# Patient Record
Sex: Female | Born: 1940 | Race: Black or African American | Hispanic: No | Smoking: Never smoker
Health system: Southern US, Community
[De-identification: ages and names within clinical notes are randomized; demographics above are authoritative.]

## PROBLEM LIST (undated history)

## (undated) DIAGNOSIS — E7800 Pure hypercholesterolemia, unspecified: Secondary | ICD-10-CM

## (undated) DIAGNOSIS — I10 Essential (primary) hypertension: Secondary | ICD-10-CM

## (undated) DIAGNOSIS — M1990 Unspecified osteoarthritis, unspecified site: Secondary | ICD-10-CM

## (undated) DIAGNOSIS — K219 Gastro-esophageal reflux disease without esophagitis: Secondary | ICD-10-CM

## (undated) DIAGNOSIS — E119 Type 2 diabetes mellitus without complications: Secondary | ICD-10-CM

## (undated) HISTORY — PX: ABDOMINAL HYSTERECTOMY: SHX81

## (undated) HISTORY — DX: Pure hypercholesterolemia, unspecified: E78.00

## (undated) HISTORY — DX: Unspecified osteoarthritis, unspecified site: M19.90

## (undated) HISTORY — DX: Gastro-esophageal reflux disease without esophagitis: K21.9

## (undated) HISTORY — PX: OTHER SURGICAL HISTORY: SHX169

---

## 2012-08-18 ENCOUNTER — Encounter (HOSPITAL_COMMUNITY): Payer: Self-pay

## 2012-08-18 ENCOUNTER — Emergency Department (HOSPITAL_COMMUNITY): Payer: Medicare Other

## 2012-08-18 ENCOUNTER — Emergency Department (HOSPITAL_COMMUNITY)
Admission: EM | Admit: 2012-08-18 | Discharge: 2012-08-18 | Disposition: A | Discharge status: Home / Self Care | Payer: Medicare Other | Attending: Emergency Medicine | Admitting: Emergency Medicine

## 2012-08-18 DIAGNOSIS — I10 Essential (primary) hypertension: Secondary | ICD-10-CM | POA: Insufficient documentation

## 2012-08-18 DIAGNOSIS — E119 Type 2 diabetes mellitus without complications: Secondary | ICD-10-CM | POA: Insufficient documentation

## 2012-08-18 DIAGNOSIS — R112 Nausea with vomiting, unspecified: Secondary | ICD-10-CM | POA: Insufficient documentation

## 2012-08-18 DIAGNOSIS — K859 Acute pancreatitis without necrosis or infection, unspecified: Secondary | ICD-10-CM | POA: Insufficient documentation

## 2012-08-18 HISTORY — DX: Essential (primary) hypertension: I10

## 2012-08-18 HISTORY — DX: Type 2 diabetes mellitus without complications: E11.9

## 2012-08-18 LAB — CBC WITH DIFFERENTIAL/PLATELET
Lymphocytes Relative: 40 % (ref 12–46)
Lymphs Abs: 2.8 10*3/uL (ref 0.7–4.0)
Neutro Abs: 3.3 10*3/uL (ref 1.7–7.7)
Neutrophils Relative %: 47 % (ref 43–77)
Platelets: 292 10*3/uL (ref 150–400)
RBC: 3.65 MIL/uL — ABNORMAL LOW (ref 3.87–5.11)
WBC: 7 10*3/uL (ref 4.0–10.5)

## 2012-08-18 LAB — URINE MICROSCOPIC-ADD ON

## 2012-08-18 LAB — COMPREHENSIVE METABOLIC PANEL
AST: 18 U/L (ref 0–37)
CO2: 28 mEq/L (ref 19–32)
Calcium: 10.1 mg/dL (ref 8.4–10.5)
Creatinine, Ser: 1.09 mg/dL (ref 0.50–1.10)
GFR calc Af Amer: 57 mL/min — ABNORMAL LOW (ref >90)
GFR calc non Af Amer: 49 mL/min — ABNORMAL LOW (ref >90)

## 2012-08-18 LAB — URINALYSIS, ROUTINE W REFLEX MICROSCOPIC
Glucose, UA: NEGATIVE mg/dL
Leukocytes, UA: NEGATIVE
Nitrite: POSITIVE — AB
pH: 7 (ref 5.0–8.0)

## 2012-08-18 LAB — GLUCOSE, CAPILLARY: Glucose-Capillary: 92 mg/dL (ref 70–99)

## 2012-08-18 LAB — LIPASE, BLOOD: Lipase: 285 U/L — ABNORMAL HIGH (ref 11–59)

## 2012-08-18 LAB — TROPONIN I: Troponin I: <0.3 ng/mL (ref <0.30)

## 2012-08-18 MED ORDER — ONDANSETRON 8 MG PO TBDP
8.0000 mg | ORAL_TABLET | Freq: Once | ORAL | Status: AC
  Administered 2012-08-18: 8 mg via ORAL
  Filled 2012-08-18: qty 1

## 2012-08-18 MED ORDER — ONDANSETRON HCL 8 MG PO TABS: 8.0000 mg | ORAL_TABLET | Freq: Three times a day (TID) | ORAL | Status: DC | PRN

## 2012-08-18 NOTE — ED Notes (Signed)
Pt c/o vomiting that started Friday but has not vomited since the previous day. Nausea continues.

## 2012-08-18 NOTE — ED Provider Notes (Addendum)
CSN: [id number]     Arrival date & time 08/18/12  1655 History  This chart was scribed for Doug Sou, MD by Bennett Scrape, ED Scribe. This patient was seen in room APA14/[facility]14 and the patient's care was started at 5:30 PM.   First MD Initiated Contact with Patient 08/18/12 1722     Chief Complaint  Patient presents with  . Emesis  . Abdominal Pain    Patient is a 72 y.o. female presenting with abdominal pain. The history is provided by the patient. No language interpreter was used.  Abdominal Pain This is a recurrent problem. The current episode started more than 2 days ago. The problem occurs constantly. The problem has been gradually improving. Associated symptoms include abdominal pain. Nothing aggravates the symptoms. Treatments tried: GERD meds. The treatment provided no relief.    HPI Comments: Caitlin Yates is a 72 y.o. female who presents to the Emergency Department complaining of 3 days of non-radiating LUQ pain described as burning with associated nausea and emesis. Last episode of emesis was 2 days ago, but she still reports nausea. She rates her pain a 5 out of 10 today which she states is mild.presently pain -feree but still nauseated  She admits that she feels significantly better today compared to yesterday. She admits experiencing prior episodes of the same diagnosed as GERD .She states that she has been taking her GERD medications as prescribed with no improvement. Pt denies taking OTC medications at home to improve symptoms. Last normal BM was yesterday. She denies fevers, hematemesis, CP and SOB as associated symptoms. She has a h/o DM and leg stent placement due to poor circulation. Pt denies smoking and alcohol use. No treatment pta. Nothing make sx better or worse. No hematemesis. Last bm yesterday, normal  Past Medical History  Diagnosis Date  . Diabetes mellitus without complication   . Hypertension    History reviewed. No pertinent past surgical history. No  family history on file. History  Substance Use Topics  . Smoking status: Never Smoker   . Smokeless tobacco: Not on file  . Alcohol Use: No  Review of Systems  Constitutional: Negative.   HENT: Negative.   Respiratory: Negative.   Cardiovascular: Negative.   Gastrointestinal: Positive for nausea and abdominal pain.  Musculoskeletal: Negative.   Skin: Negative.   Neurological: Negative.   Psychiatric/Behavioral: Negative.     Allergies  Review of patient's allergies indicates no known allergies.  Home Medications  No current outpatient prescriptions on file.  Triage Vitals: BP 125/92  Pulse 83  Temp(Src) 97.7 F (36.5 C) (Oral)  Resp 18  Ht 5\' 2"  (1.575 m)  Wt 174 lb (78.926 kg)  BMI 31.82 kg/m2  SpO2 100%  Physical Exam  Nursing note and vitals reviewed. Constitutional: She appears well-developed and well-nourished.  HENT:  Head: Normocephalic and atraumatic.  Eyes: Conjunctivae are normal. Pupils are equal, round, and reactive to light.  Neck: Neck supple. No tracheal deviation present. No thyromegaly present.  Cardiovascular: Normal rate and regular rhythm.   No murmur heard. Pulmonary/Chest: Effort normal and breath sounds normal.  Abdominal: Soft. Bowel sounds are normal. She exhibits no distension. There is no tenderness.  Musculoskeletal: Normal range of motion. She exhibits no edema and no tenderness.  Neurological: She is alert. Coordination normal.  Skin: Skin is warm and dry. No rash noted.  Psychiatric: She has a normal mood and affect.    ED Course   Procedures (including critical care time)  DIAGNOSTIC  STUDIES: Oxygen Saturation is 100% on room air, normal by my interpretation.    COORDINATION OF CARE: 5:33 PM-Discussed treatment plan which includes x-ray of abdomen, CBC panel, CMP and UA with pt at bedside and pt agreed to plan. Pt declined pain medication  Labs Reviewed  COMPREHENSIVE METABOLIC PANEL - Abnormal; Notable for the following:     Total Bilirubin 0.1 (*)    GFR calc non Af Amer 49 (*)    GFR calc Af Amer 57 (*)    All other components within normal limits  LIPASE, BLOOD - Abnormal; Notable for the following:    Lipase 285 (*)    All other components within normal limits  CBC WITH DIFFERENTIAL - Abnormal; Notable for the following:    RBC 3.65 (*)    Hemoglobin 10.9 (*)    HCT 33.1 (*)    All other components within normal limits  URINALYSIS, ROUTINE W REFLEX MICROSCOPIC - Abnormal; Notable for the following:    APPearance HAZY (*)    Nitrite POSITIVE (*)    All other components within normal limits  URINE MICROSCOPIC-ADD ON - Abnormal; Notable for the following:    Bacteria, UA MANY (*)    All other components within normal limits  URINE CULTURE  GLUCOSE, CAPILLARY  TROPONIN I   Dg Abd Acute W/chest  08/18/2012   *RADIOLOGY REPORT*  Clinical Data: Abdominal pain with emesis today.  History of hypertension and diabetes.  ACUTE ABDOMEN SERIES (ABDOMEN 2 VIEW & CHEST 1 VIEW)  Comparison: None.  Findings: The heart size is normal.  There is mild atelectasis or scarring adjacent to the left heart border.  The lungs are otherwise clear.  There is no pleural effusion.  Moderate stool is present throughout the colon.  There is no evidence of bowel obstruction or free intraperitoneal air.  There is a left upper quadrant calcification measuring 8 mm.  This may be renal or splenic.  No other suspicious calcifications are identified.  There are multiple pelvic calcifications bilaterally which are likely phleboliths.  IMPRESSION:  1.  No acute abdominal or cardiopulmonary process identified. 2.  Nonspecific left upper quadrant calcification. 3.  Prominent stool throughout the colon, possibly reflecting constipation.   Original Report Authenticated By: Carey Bullocks, M.D.   No diagnosis found.   Date: 08/18/2012  Rate: 75  Rhythm: normal sinus rhythm and premature ventricular contractions (PVC)  QRS Axis: left  Intervals:  normal  ST/T Wave abnormalities: nonspecific T wave changes  Conduction Disutrbances:none  Narrative Interpretation:   Old EKG Reviewed: none available Results for orders placed during the hospital encounter of 08/18/12  GLUCOSE, CAPILLARY      Result Value Range   Glucose-Capillary 92  70 - 99 mg/dL  COMPREHENSIVE METABOLIC PANEL      Result Value Range   Sodium 139  135 - 145 mEq/L   Potassium 3.6  3.5 - 5.1 mEq/L   Chloride 99  96 - 112 mEq/L   CO2 28  19 - 32 mEq/L   Glucose, Bld 84  70 - 99 mg/dL   BUN 20  6 - 23 mg/dL   Creatinine, Ser 4.09  0.50 - 1.10 mg/dL   Calcium 81.1  8.4 - 91.4 mg/dL   Total Protein 7.3  6.0 - 8.3 g/dL   Albumin 3.8  3.5 - 5.2 g/dL   AST 18  0 - 37 U/L   ALT 13  0 - 35 U/L   Alkaline Phosphatase 48  39 -  117 U/L   Total Bilirubin 0.1 (*) 0.3 - 1.2 mg/dL   GFR calc non Af Amer 49 (*) >90 mL/min   GFR calc Af Amer 57 (*) >90 mL/min  LIPASE, BLOOD      Result Value Range   Lipase 285 (*) 11 - 59 U/L  CBC WITH DIFFERENTIAL      Result Value Range   WBC 7.0  4.0 - 10.5 K/uL   RBC 3.65 (*) 3.87 - 5.11 MIL/uL   Hemoglobin 10.9 (*) 12.0 - 15.0 g/dL   HCT 78.2 (*) 95.6 - 21.3 %   MCV 90.7  78.0 - 100.0 fL   MCH 29.9  26.0 - 34.0 pg   MCHC 32.9  30.0 - 36.0 g/dL   RDW 08.6  57.8 - 46.9 %   Platelets 292  150 - 400 K/uL   Neutrophils Relative % 47  43 - 77 %   Neutro Abs 3.3  1.7 - 7.7 K/uL   Lymphocytes Relative 40  12 - 46 %   Lymphs Abs 2.8  0.7 - 4.0 K/uL   Monocytes Relative 9  3 - 12 %   Monocytes Absolute 0.6  0.1 - 1.0 K/uL   Eosinophils Relative 4  0 - 5 %   Eosinophils Absolute 0.3  0.0 - 0.7 K/uL   Basophils Relative 0  0 - 1 %   Basophils Absolute 0.0  0.0 - 0.1 K/uL  URINALYSIS, ROUTINE W REFLEX MICROSCOPIC      Result Value Range   Color, Urine YELLOW  YELLOW   APPearance HAZY (*) CLEAR   Specific Gravity, Urine 1.020  1.005 - 1.030   pH 7.0  5.0 - 8.0   Glucose, UA NEGATIVE  NEGATIVE mg/dL   Hgb urine dipstick NEGATIVE   NEGATIVE   Bilirubin Urine NEGATIVE  NEGATIVE   Ketones, ur NEGATIVE  NEGATIVE mg/dL   Protein, ur NEGATIVE  NEGATIVE mg/dL   Urobilinogen, UA 0.2  0.0 - 1.0 mg/dL   Nitrite POSITIVE (*) NEGATIVE   Leukocytes, UA NEGATIVE  NEGATIVE  TROPONIN I      Result Value Range   Troponin I <0.30  <0.30 ng/mL  URINE MICROSCOPIC-ADD ON      Result Value Range   WBC, UA 0-2  <3 WBC/hpf   RBC / HPF 0-2  <3 RBC/hpf   Bacteria, UA MANY (*) RARE   Dg Abd Acute W/chest  08/18/2012   *RADIOLOGY REPORT*  Clinical Data: Abdominal pain with emesis today.  History of hypertension and diabetes.  ACUTE ABDOMEN SERIES (ABDOMEN 2 VIEW & CHEST 1 VIEW)  Comparison: None.  Findings: The heart size is normal.  There is mild atelectasis or scarring adjacent to the left heart border.  The lungs are otherwise clear.  There is no pleural effusion.  Moderate stool is present throughout the colon.  There is no evidence of bowel obstruction or free intraperitoneal air.  There is a left upper quadrant calcification measuring 8 mm.  This may be renal or splenic.  No other suspicious calcifications are identified.  There are multiple pelvic calcifications bilaterally which are likely phleboliths.  IMPRESSION:  1.  No acute abdominal or cardiopulmonary process identified. 2.  Nonspecific left upper quadrant calcification. 3.  Prominent stool throughout the colon, possibly reflecting constipation.   Original Report Authenticated By: Carey Bullocks, M.D.    7:35 PM patient feels well after treatment with oral Zofran. No longer nauseated. Abdominal discomfort is minimal. She declines pain medicine.  X-rays viewed by me . MDM  Case discussed with Rourk plan clear liquid diet for 12 hours. If she feels well she can advance to bland diet tomorrow. Dr.Rourk's office will call hertomorrow to arrange to be seen for outpatient evaluation in the office within 48 hours. She is instructed to return to the emergency department if he feels worse for  any reason. Prescriptions zofran Dx#1 pancreatitis #2anemia      Doug Sou, MD 08/18/12 1950  Doug Sou, MD 08/19/12 0001

## 2012-08-18 NOTE — ED Notes (Signed)
Pt c/o nausea and vomiting since Friday. Pt states she has not vomited today but still feels nauseated. Pt denies abdominal and chest pain, SOB.

## 2012-08-19 ENCOUNTER — Encounter: Payer: Self-pay | Admitting: Gastroenterology

## 2012-08-19 ENCOUNTER — Telehealth: Payer: Self-pay | Admitting: Internal Medicine

## 2012-08-19 ENCOUNTER — Ambulatory Visit (INDEPENDENT_AMBULATORY_CARE_PROVIDER_SITE_OTHER): Payer: PRIVATE HEALTH INSURANCE | Admitting: Gastroenterology

## 2012-08-19 VITALS — BP 123/71 | HR 82 | Temp 97.0°F | Ht 62.0 in | Wt 172.6 lb

## 2012-08-19 DIAGNOSIS — K859 Acute pancreatitis without necrosis or infection, unspecified: Secondary | ICD-10-CM

## 2012-08-19 MED ORDER — DEXLANSOPRAZOLE 60 MG PO CPDR: 60.0000 mg | DELAYED_RELEASE_CAPSULE | Freq: Every day | ORAL | Status: DC

## 2012-08-19 MED ORDER — HYDROCODONE-ACETAMINOPHEN 5-325 MG PO TABS: 1.0000 | ORAL_TABLET | Freq: Four times a day (QID) | ORAL | Status: DC | PRN

## 2012-08-19 NOTE — Progress Notes (Signed)
Primary Care Physician:  Leone Payor, MD Primary Gastroenterologist:  Dr. Jena Gauss   Chief Complaint  Patient presents with  . Pancreatitis    hospital follow up    HPI:   Caitlin Yates is a 72 year old female who presents today after an ED visit yesterday evening secondary to abdominal pain and GERD symptoms. LUQ pain noted with some improvement at time of ED visit. Found to have elevated lipase at 285, normal LFTs, and normocytic anemia with Hgb 10.9.  Denies alcohol use. States Friday acute onset of epigastric pain. N/V till Sunday. Gagging, dry heaving. LUQ discomfort improved slightly but still persistent at times. No hematemesis. No melena. One other episode about a month ago of fleeting pain. Denies fever/chills. "heavy" food worsens abdominal pain. Omeprazole daily up until last Friday.   No changes in bowel habits. No rectal bleeding. Last colonoscopy about 3-4 years ago per patient, in [place] by Dr. Samuella Cota. No prior EGD.   Past Medical History  Diagnosis Date  . Diabetes mellitus without complication   . Hypertension   . Hypercholesterolemia   . GERD (gastroesophageal reflux disease)     Past Surgical History  Procedure Laterality Date  . Vascular stent right leg      Current Outpatient Prescriptions  Medication Sig Dispense Refill  . aspirin EC 81 MG tablet Take 81 mg by mouth every morning.      Marland Kitchen atenolol-chlorthalidone (TENORETIC) 50-25 MG per tablet Take 1 tablet by mouth every morning.      Marland Kitchen atorvastatin (LIPITOR) 20 MG tablet Take 20 mg by mouth every morning.      . clopidogrel (PLAVIX) 75 MG tablet Take 75 mg by mouth every morning.      Marland Kitchen glimepiride (AMARYL) 2 MG tablet Take 2 mg by mouth every morning.      Marland Kitchen lisinopril (PRINIVIL,ZESTRIL) 10 MG tablet Take 10 mg by mouth every morning.      . Multiple Vitamins-Minerals (CENTURY MATURE ADULT FORMULA PO) Take 1 tablet by mouth every morning.      . ondansetron (ZOFRAN) 8 MG tablet Take 1 tablet (8 mg  total) by mouth every 8 (eight) hours as needed for nausea.  6 tablet  0  . potassium chloride SA (K-DUR,KLOR-CON) 20 MEQ tablet Take 20 mEq by mouth daily.      . sitaGLIPtan-metformin (JANUMET) 50-1000 MG per tablet Take 1 tablet by mouth 2 (two) times daily.      . sucralfate (CARAFATE) 1 G tablet Take 1 g by mouth daily as needed (for stomach pain).      . traMADol (ULTRAM) 50 MG tablet Take 50 mg by mouth 2 (two) times daily.      Marland Kitchen dexlansoprazole (DEXILANT) 60 MG capsule Take 1 capsule (60 mg total) by mouth daily.  30 capsule  3  . HYDROcodone-acetaminophen (NORCO) 5-325 MG per tablet Take 1 tablet by mouth every 6 (six) hours as needed for pain.  30 tablet  0   No current facility-administered medications for this visit.    Allergies as of 08/19/2012  . (No Known Allergies)    Family History  Problem Relation Age of Onset  . Colon cancer Neg Hx     History   Social History  . Marital Status: Legally Separated    Spouse Name: N/A    Number of Children: N/A  . Years of Education: N/A   Occupational History  . Not on file.   Social History Main Topics  . Smoking status:  Never Smoker   . Smokeless tobacco: Not on file  . Alcohol Use: No  . Drug Use: No  . Sexually Active: Not on file   Other Topics Concern  . Not on file   Social History Narrative  . No narrative on file    Review of Systems: Negative unless mentioned in HPI.   Physical Exam: BP 123/71  Pulse 82  Temp(Src) 97 F (36.1 C) (Oral)  Ht 5\' 2"  (1.575 m)  Wt 172 lb 9.6 oz (78.291 kg)  BMI 31.56 kg/m2 General:   Alert and oriented. Pleasant and cooperative. Without distress.  Head:  Normocephalic and atraumatic. Eyes:  Without icterus, sclera clear and conjunctiva pink.  Ears:  Normal auditory acuity. Nose:  No deformity, discharge,  or lesions. Mouth:  No deformity or lesions, oral mucosa pink.  Neck:  Supple, without mass or thyromegaly. Lungs:  Clear to auscultation bilaterally. No  wheezes, rales, or rhonchi. No distress.  Heart:  S1, S2 present without murmurs appreciated.  Abdomen:  +BS, soft, mild TTP LUQ, epigastric region but without rebound or guarding.  No HSM noted. No masses appreciated.  Rectal:  Deferred  Msk:  Symmetrical without gross deformities. Normal posture. Extremities:  Without clubbing or edema. Neurologic:  Alert and  oriented x4;  grossly normal neurologically. Skin:  Intact without significant lesions or rashes. Cervical Nodes:  No significant cervical adenopathy. Psych:  Alert and cooperative. Normal mood and affect.   Lipase     Component Value Date/Time   LIPASE 285* 08/18/2012 1758

## 2012-08-19 NOTE — Telephone Encounter (Signed)
ER doctor called me about this lady last night. Abdominal pain for 3 days and better in the ER last night. Lipase elevated. Needs a workup for abdominal pain. I promised a call from this office this morning for an appointment this week - extender. Contact telephone number [phone]. Thanks. date of birth December 22, 1940 ----- Message ----- From: SYSTEM Sent: 08/18/2012 8:08 PM To: Corbin Ade, MD

## 2012-08-19 NOTE — Telephone Encounter (Signed)
Pt is coming today to see AS at 3pm in Urgent spot

## 2012-08-19 NOTE — Telephone Encounter (Signed)
Pt called this morning said she was in ER last night and was told to make an OV with [facility] in 2 days. I told her we have nothing available until 8/28 and I could always call her if I have a cancellation before then. She said ER told her that she has acute pancreatitis and needed to be seen soon. Please advise and call [phone]

## 2012-08-19 NOTE — Patient Instructions (Addendum)
I have sent in a prescription for a pain medication called Norco. Take 1 tablet every 6 hours as needed for pain. Do not take Tramadol while you are taking Norco.   STOP PRILOSEC. I want you to start taking Dexilant, a different reflux medication. Take this once a day.   It is very important you stay hydrated and follow a very careful diet right now. For the next 24-48 hours, I want you to follow the clear liquid diet. If you start feeling better in 24-48 hours, you can advance to a low-fat diet. Please see attached handouts.  We have set you up for a CT scan of your belly. Please have blood work done today as well. Do not take metformin for 48 hours after the CT scan.   I will see you back in 2 weeks to see how you are doing and discuss further work-up.

## 2012-08-20 DIAGNOSIS — K859 Acute pancreatitis without necrosis or infection, unspecified: Secondary | ICD-10-CM | POA: Insufficient documentation

## 2012-08-20 LAB — URINE CULTURE

## 2012-08-20 LAB — LIPID PANEL
Cholesterol: 147 mg/dL (ref 0–200)
HDL: 41 mg/dL (ref >39)
Total CHOL/HDL Ratio: 3.6 Ratio

## 2012-08-20 LAB — FERRITIN: Ferritin: 30 ng/mL (ref 10–291)

## 2012-08-20 NOTE — Progress Notes (Signed)
Quick Note:  Awaiting CT.  Lipid panel normal.  Iron and ferritin normal.  Question anemia of chronic disease. ______

## 2012-08-20 NOTE — Assessment & Plan Note (Signed)
72 year old female presenting in follow-up from the ED, with presentation consistent with acute pancreatitis. Unclear etiology at this point, as HFP is normal, she denies ETOH use, and low-likelihood of medications as culprit. No CT on file.  She appears to be in no distress at time of visit, and she seems to be clinically improving overall since initial onset on Friday. I discussed outpatient management to include hydration, clear liquids for the next 24-48 hours, and pain control. I would like to obtain a CT abd/pelvis tomorrow, but she is unable to proceed with this until Thursday.   1. CT on Thursday 2. Supportive measures as outlined 3. To ED if worsening of symptoms 4. EUS in 4-6 weeks 5. Lipid panel to assess for hypertriglyceridemia 6. Stop Prilosec, as she is on Plavix. Start Dexilant once daily.  7. Norco provided 8. Continue Zofran per ED 9. Check iron, ferritin due to anemia.  10. Obtain last colonoscopy for our records.

## 2012-08-21 ENCOUNTER — Ambulatory Visit (HOSPITAL_COMMUNITY)
Admission: RE | Admit: 2012-08-21 | Discharge: 2012-08-21 | Disposition: A | Discharge status: Home / Self Care | Payer: Medicare Other | Source: Ambulatory Visit | Attending: Gastroenterology | Admitting: Gastroenterology

## 2012-08-21 ENCOUNTER — Other Ambulatory Visit: Payer: Self-pay | Admitting: Gastroenterology

## 2012-08-21 ENCOUNTER — Telehealth (HOSPITAL_COMMUNITY): Payer: Self-pay | Admitting: Emergency Medicine

## 2012-08-21 DIAGNOSIS — K859 Acute pancreatitis without necrosis or infection, unspecified: Secondary | ICD-10-CM

## 2012-08-21 DIAGNOSIS — D3500 Benign neoplasm of unspecified adrenal gland: Secondary | ICD-10-CM | POA: Insufficient documentation

## 2012-08-21 DIAGNOSIS — R1110 Vomiting, unspecified: Secondary | ICD-10-CM | POA: Insufficient documentation

## 2012-08-21 DIAGNOSIS — R1013 Epigastric pain: Secondary | ICD-10-CM | POA: Insufficient documentation

## 2012-08-21 MED ORDER — IOHEXOL 300 MG/ML  SOLN
100.0000 mL | Freq: Once | INTRAMUSCULAR | Status: AC | PRN
  Administered 2012-08-21: 100 mL via INTRAVENOUS

## 2012-08-21 NOTE — Progress Notes (Signed)
Quick Note:  CT reviewed.  No evidence of pancreatitis on CT.  How is patient doing? ______

## 2012-08-21 NOTE — Progress Notes (Signed)
ED Antimicrobial Stewardship Positive Culture Follow Up   [name] is an 72 y.o. female who presented to [facility] on 08/18/2012 with a chief complaint of  Chief Complaint  Patient presents with  . Emesis  . Abdominal Pain    Recent Results (from the past 720 hour(s))  URINE CULTURE     Status: None   Collection Time    08/18/12  6:35 PM      Result Value Range Status   Specimen Description URINE, CLEAN CATCH   Final   Special Requests NONE   Final   Culture  Setup Time     Final   Value: 08/19/2012 00:32     Performed at [facility] Count     Final   Value: >=100,000 COLONIES/ML     Performed at [facility]   Culture     Final   Value: ESCHERICHIA COLI     Performed at [facility]   Report Status 08/20/2012 FINAL   Final   Organism ID, Bacteria ESCHERICHIA COLI   Final    []  Treated with , organism resistant to prescribed antimicrobial [x]  Patient discharged originally without antimicrobial agent and treatment is now indicated  New antibiotic prescription: Keflex 500mg  PO BID x 7 days  ED Provider: Doran Durand, PA-C   Cleon Dew 08/21/2012, 10:22 AM Infectious Diseases Pharmacist Phone# [phone]

## 2012-08-21 NOTE — Progress Notes (Signed)
Spoke with Victorino Dike in CT. CT has been changed to pancreatic protocol.

## 2012-08-21 NOTE — Progress Notes (Signed)
CC PCP 

## 2012-08-21 NOTE — ED Notes (Signed)
Post ED Visit - Positive Culture Follow-up: Successful Patient Follow-Up  Culture assessed and recommendations reviewed by: [x]  Wes Dulaney, Pharm.D., BCPS []  Celedonio Miyamoto, Pharm.D., BCPS []  Georgina Pillion, Pharm.D., BCPS []  Lomas, [address].D., BCPS, AAHIVP []  Estella Husk, Pharm.D., BCPS, AAHIVP  Positive urine culture  []  Patient discharged without antimicrobial prescription and treatment is now indicated []  Organism is resistant to prescribed ED discharge antimicrobial []  Patient with positive blood cultures  Changes discussed with ED provider: Doran Durand PA-C New antibiotic prescription: Keflex 500 mg PO BID x 7 days    Kylie A Holland 08/21/2012, 2:35 PM

## 2012-08-24 ENCOUNTER — Telehealth (HOSPITAL_COMMUNITY): Payer: Self-pay | Admitting: Emergency Medicine

## 2012-08-27 NOTE — ED Notes (Signed)
Letter sent to [facility] address 

## 2012-12-01 ENCOUNTER — Other Ambulatory Visit: Payer: Self-pay

## 2012-12-02 MED ORDER — DEXLANSOPRAZOLE 60 MG PO CPDR: 60.0000 mg | DELAYED_RELEASE_CAPSULE | Freq: Every day | ORAL | Status: DC

## 2013-06-11 ENCOUNTER — Other Ambulatory Visit (HOSPITAL_COMMUNITY): Payer: Self-pay | Admitting: Internal Medicine

## 2013-06-11 DIAGNOSIS — Z1231 Encounter for screening mammogram for malignant neoplasm of breast: Secondary | ICD-10-CM

## 2013-06-19 ENCOUNTER — Ambulatory Visit (HOSPITAL_COMMUNITY)
Admission: RE | Admit: 2013-06-19 | Discharge: 2013-06-19 | Disposition: A | Discharge status: Home / Self Care | Payer: PRIVATE HEALTH INSURANCE | Source: Ambulatory Visit | Attending: Internal Medicine | Admitting: Internal Medicine

## 2013-06-19 DIAGNOSIS — Z1231 Encounter for screening mammogram for malignant neoplasm of breast: Secondary | ICD-10-CM | POA: Insufficient documentation

## 2013-09-23 ENCOUNTER — Encounter (HOSPITAL_COMMUNITY): Payer: Self-pay | Admitting: Emergency Medicine

## 2013-09-23 ENCOUNTER — Emergency Department (HOSPITAL_COMMUNITY)
Admission: EM | Admit: 2013-09-23 | Discharge: 2013-09-23 | Disposition: A | Discharge status: Home / Self Care | Payer: PRIVATE HEALTH INSURANCE | Attending: Emergency Medicine | Admitting: Emergency Medicine

## 2013-09-23 DIAGNOSIS — Z7982 Long term (current) use of aspirin: Secondary | ICD-10-CM | POA: Insufficient documentation

## 2013-09-23 DIAGNOSIS — E119 Type 2 diabetes mellitus without complications: Secondary | ICD-10-CM | POA: Insufficient documentation

## 2013-09-23 DIAGNOSIS — R220 Localized swelling, mass and lump, head: Secondary | ICD-10-CM | POA: Diagnosis not present

## 2013-09-23 DIAGNOSIS — I10 Essential (primary) hypertension: Secondary | ICD-10-CM | POA: Insufficient documentation

## 2013-09-23 DIAGNOSIS — Z7902 Long term (current) use of antithrombotics/antiplatelets: Secondary | ICD-10-CM | POA: Diagnosis not present

## 2013-09-23 DIAGNOSIS — R221 Localized swelling, mass and lump, neck: Secondary | ICD-10-CM | POA: Diagnosis present

## 2013-09-23 DIAGNOSIS — E7800 Pure hypercholesterolemia, unspecified: Secondary | ICD-10-CM | POA: Insufficient documentation

## 2013-09-23 DIAGNOSIS — K219 Gastro-esophageal reflux disease without esophagitis: Secondary | ICD-10-CM | POA: Insufficient documentation

## 2013-09-23 MED ORDER — PREDNISONE 20 MG PO TABS
40.0000 mg | ORAL_TABLET | Freq: Once | ORAL | Status: AC
  Administered 2013-09-23: 40 mg via ORAL
  Filled 2013-09-23: qty 2

## 2013-09-23 MED ORDER — DIPHENHYDRAMINE HCL 12.5 MG/5ML PO ELIX
12.5000 mg | ORAL_SOLUTION | Freq: Once | ORAL | Status: AC
  Administered 2013-09-23: 12.5 mg via ORAL
  Filled 2013-09-23: qty 5

## 2013-09-23 MED ORDER — PREDNISONE 10 MG PO TABS: ORAL_TABLET | ORAL | Status: DC

## 2013-09-23 NOTE — ED Notes (Addendum)
Pt reports woke up this am and reports right side of tongue swollen. Pt reports last night right ear felt "full" as well. nad noted.Airway patent. No drooling noted. Pt reports only new med px that she started was an abx from last week. Pt also reports taking lisinopril for several years. Pt denies any new self care products. Pt denies any increase in swelling since waking up this am.

## 2013-09-23 NOTE — Discharge Instructions (Signed)
Your exam today shows that you have swelling to the right side of your tongue. We are not sure of the cause. You may have bit your tongue while sleeping or you may have reacted to the peach that you ate.  Since you are not having difficulty swallowing and the symptoms are improving we are going to let you go home and continue to take prednisone for the swelling. You will need to follow up with your doctor tomorrow to be sure the symptoms continue to improve. If you develop worsening symptoms, difficulty swallowing or other problems, return here immediately. Drink cold liquids to help decrease the swelling.

## 2013-09-23 NOTE — ED Provider Notes (Signed)
CSN: [id number]     Arrival date & time 09/23/13  1027 History   First MD Initiated Contact with Patient 09/23/13 1045     Chief Complaint  Patient presents with  . Oral Swelling     (Consider location/radiation/quality/duration/timing/severity/associated sxs/prior Treatment) HPI Caitlin Yates is a 73 y.o. female who presents to the ED with swelling of her tongue and the right side of her face. She states that last night before she went to bed she ate a peach. This morning when she woke she felt like her tongue was swollen and had swelling to the right side of her face and full feeling in her right ear. She states she had something similar happen a few years ago and had to be admitted to the hospital because she could not swallow. She had 2 upper right teeth extracted last week and was taking Hydrocodone but has not taken that in 3 days. She has not started anything else. She states that she did not eat this morning due to the swelling. Now the symptoms have improved some and she is requesting something to eat.   Past Medical History  Diagnosis Date  . Diabetes mellitus without complication   . Hypertension   . Hypercholesterolemia   . GERD (gastroesophageal reflux disease)    Past Surgical History  Procedure Laterality Date  . Vascular stent right leg     Family History  Problem Relation Age of Onset  . Colon cancer Neg Hx    History  Substance Use Topics  . Smoking status: Never Smoker   . Smokeless tobacco: Not on file  . Alcohol Use: No   OB History   Grav Para Term Preterm Abortions TAB SAB Ect Mult Living                 Review of Systems Negative except as states in HPI   Allergies  Review of patient's allergies indicates no known allergies.  Home Medications   Prior to Admission medications   Medication Sig Start Date End Date Taking? Authorizing Provider  aspirin EC 81 MG tablet Take 81 mg by mouth every morning.    Historical Provider, MD    atenolol-chlorthalidone (TENORETIC) 50-25 MG per tablet Take 1 tablet by mouth every morning.    Historical Provider, MD  atorvastatin (LIPITOR) 20 MG tablet Take 20 mg by mouth every morning.    Historical Provider, MD  clopidogrel (PLAVIX) 75 MG tablet Take 75 mg by mouth every morning.    Historical Provider, MD  dexlansoprazole (DEXILANT) 60 MG capsule Take 1 capsule (60 mg total) by mouth daily. 12/01/12   Mahala Menghini, PA-C  glimepiride (AMARYL) 2 MG tablet Take 2 mg by mouth every morning.    Historical Provider, MD  HYDROcodone-acetaminophen (NORCO) 5-325 MG per tablet Take 1 tablet by mouth every 6 (six) hours as needed for pain. 08/19/12   Orvil Feil, NP  lisinopril (PRINIVIL,ZESTRIL) 10 MG tablet Take 10 mg by mouth every morning.    Historical Provider, MD  Multiple Vitamins-Minerals (CENTURY MATURE ADULT FORMULA PO) Take 1 tablet by mouth every morning.    Historical Provider, MD  ondansetron (ZOFRAN) 8 MG tablet Take 1 tablet (8 mg total) by mouth every 8 (eight) hours as needed for nausea. 08/18/12   Orlie Dakin, MD  potassium chloride SA (K-DUR,KLOR-CON) 20 MEQ tablet Take 20 mEq by mouth daily.    Historical Provider, MD  sitaGLIPtan-metformin (JANUMET) 50-1000 MG per tablet Take 1 tablet  by mouth 2 (two) times daily.    Historical Provider, MD  sucralfate (CARAFATE) 1 G tablet Take 1 g by mouth daily as needed (for stomach pain).    Historical Provider, MD  traMADol (ULTRAM) 50 MG tablet Take 50 mg by mouth 2 (two) times daily.    Historical Provider, MD   BP 150/86  Pulse 73  Temp(Src) 98.2 F (36.8 C) (Oral)  Resp 18  Ht 5\' 2"  (1.575 m)  Wt 178 lb (80.74 kg)  BMI 32.55 kg/m2  SpO2 100% Physical Exam  Nursing note and vitals reviewed. Constitutional: She is oriented to person, place, and time. She appears well-developed and well-nourished. No distress.  HENT:  Right Ear: Tympanic membrane and external ear normal.  Left Ear: Tympanic membrane normal.  Mouth/Throat:  Uvula is midline.    Tongue with edema on the right side.  Healing areas of the right upper gum where teeth were extracted without signs of infection.  Patient swallowing saliva without difficulty.  Eyes: Conjunctivae and EOM are normal.  Neck: Neck supple.  Cardiovascular: Normal rate and regular rhythm.   Pulmonary/Chest: Effort normal. She has no wheezes. She has no rales.  Abdominal: Soft. There is no tenderness.  Musculoskeletal: Normal range of motion.  Neurological: She is alert and oriented to person, place, and time. No cranial nerve deficit.  Skin: Skin is warm and dry.  Psychiatric: She has a normal mood and affect. Her behavior is normal.    ED Course  Procedures  Benadryl PO, Pepcid, Prednisone Dr. Lacinda Axon in to examine the patient and discuss clinical findings and plan of care.  MDM  73 y.o. female with swelling to the right side of the tongue that has improved since she first noticed this morning. Will continue treatment with Prednisone and Pepcid. She will return immediately for worsening symptoms.  Discussed with the patient and all questioned fully answered.   Medication List    TAKE these medications       predniSONE 10 MG tablet  Commonly known as:  DELTASONE  Starting tomorrow 09/24/13 take 2 tablets (40 mg) BID      ASK your doctor about these medications       aspirin EC 81 MG tablet  Take 81 mg by mouth every morning.     atenolol-chlorthalidone 50-25 MG per tablet  Commonly known as:  TENORETIC  Take 1 tablet by mouth every morning.     atorvastatin 20 MG tablet  Commonly known as:  LIPITOR  Take 20 mg by mouth every morning.     CENTURY MATURE ADULT FORMULA PO  Take 1 tablet by mouth every morning.     ciprofloxacin 500 MG tablet  Commonly known as:  CIPRO  Take 1 tablet by mouth 2 (two) times daily. Starting 09/18/2013 x 7 days.     clopidogrel 75 MG tablet  Commonly known as:  PLAVIX  Take 75 mg by mouth every morning.     glyBURIDE 5 MG  tablet  Commonly known as:  DIABETA  Take 10 mg by mouth 2 (two) times daily with a meal.     HYDROcodone-acetaminophen 5-325 MG per tablet  Commonly known as:  NORCO/VICODIN  Take 1 tablet by mouth every 6 (six) hours as needed for moderate pain. Starting 09/16/2013.     lisinopril 5 MG tablet  Commonly known as:  PRINIVIL,ZESTRIL  Take 5 mg by mouth daily.     omeprazole 20 MG capsule  Commonly known as:  PRILOSEC  Take 20 mg by mouth daily.     potassium chloride SA 20 MEQ tablet  Commonly known as:  K-DUR,KLOR-CON  Take 20 mEq by mouth daily.     sitaGLIPtin-metformin 50-1000 MG per tablet  Commonly known as:  JANUMET  Take 1 tablet by mouth 2 (two) times daily.     sucralfate 1 G tablet  Commonly known as:  CARAFATE  Take 1 g by mouth 4 (four) times daily.     traMADol 50 MG tablet  Commonly known as:  ULTRAM  Take 50 mg by mouth 2 (two) times daily.            [facility] Bunnie Pion, [place] 09/23/13 1728

## 2013-09-24 NOTE — ED Provider Notes (Signed)
Medical screening examination/treatment/procedure(s) were conducted as a shared visit with non-physician practitioner(s) and myself.  I personally evaluated the patient during the encounter.   EKG Interpretation None     Right side of tongue puffy.  Good airway. Could be ACE inhibitor related, but symptoms are usually related to the entire tongue. Symptoms are improving. Discharge home with prednisone.  Nat Christen, MD 09/24/13 [phone]

## 2014-07-13 ENCOUNTER — Other Ambulatory Visit (HOSPITAL_COMMUNITY): Payer: Self-pay | Admitting: Specialist

## 2014-07-13 DIAGNOSIS — I739 Peripheral vascular disease, unspecified: Secondary | ICD-10-CM

## 2014-07-21 ENCOUNTER — Other Ambulatory Visit (HOSPITAL_COMMUNITY): Payer: Self-pay | Admitting: Internal Medicine

## 2014-07-21 ENCOUNTER — Other Ambulatory Visit (HOSPITAL_COMMUNITY): Payer: Self-pay | Admitting: Specialist

## 2014-07-21 ENCOUNTER — Ambulatory Visit (HOSPITAL_COMMUNITY)
Admission: RE | Admit: 2014-07-21 | Discharge: 2014-07-21 | Disposition: A | Discharge status: Home / Self Care | Payer: Medicare Other | Source: Ambulatory Visit | Attending: Specialist | Admitting: Specialist

## 2014-07-21 DIAGNOSIS — E119 Type 2 diabetes mellitus without complications: Secondary | ICD-10-CM | POA: Insufficient documentation

## 2014-07-21 DIAGNOSIS — I779 Disorder of arteries and arterioles, unspecified: Secondary | ICD-10-CM | POA: Diagnosis not present

## 2014-07-21 DIAGNOSIS — Z1231 Encounter for screening mammogram for malignant neoplasm of breast: Secondary | ICD-10-CM

## 2014-07-21 DIAGNOSIS — I739 Peripheral vascular disease, unspecified: Secondary | ICD-10-CM

## 2014-07-21 DIAGNOSIS — I10 Essential (primary) hypertension: Secondary | ICD-10-CM | POA: Diagnosis not present

## 2014-07-30 ENCOUNTER — Ambulatory Visit (HOSPITAL_COMMUNITY)
Admission: RE | Admit: 2014-07-30 | Discharge: 2014-07-30 | Disposition: A | Discharge status: Home / Self Care | Payer: Medicare Other | Source: Ambulatory Visit | Attending: Internal Medicine | Admitting: Internal Medicine

## 2014-07-30 DIAGNOSIS — Z1231 Encounter for screening mammogram for malignant neoplasm of breast: Secondary | ICD-10-CM | POA: Diagnosis present

## 2014-08-04 ENCOUNTER — Other Ambulatory Visit: Payer: Self-pay | Admitting: Internal Medicine

## 2014-08-04 DIAGNOSIS — R928 Other abnormal and inconclusive findings on diagnostic imaging of breast: Secondary | ICD-10-CM

## 2014-08-10 ENCOUNTER — Ambulatory Visit (HOSPITAL_COMMUNITY)
Admission: RE | Admit: 2014-08-10 | Discharge: 2014-08-10 | Disposition: A | Discharge status: Home / Self Care | Payer: Medicare Other | Source: Ambulatory Visit | Attending: Internal Medicine | Admitting: Internal Medicine

## 2014-08-10 DIAGNOSIS — R928 Other abnormal and inconclusive findings on diagnostic imaging of breast: Secondary | ICD-10-CM | POA: Insufficient documentation

## 2014-08-11 ENCOUNTER — Encounter: Payer: Self-pay | Admitting: Cardiology

## 2014-08-11 ENCOUNTER — Ambulatory Visit (INDEPENDENT_AMBULATORY_CARE_PROVIDER_SITE_OTHER): Payer: Medicare Other | Admitting: Cardiology

## 2014-08-11 VITALS — BP 132/64 | HR 79 | Ht 62.0 in | Wt 175.0 lb

## 2014-08-11 DIAGNOSIS — E785 Hyperlipidemia, unspecified: Secondary | ICD-10-CM

## 2014-08-11 DIAGNOSIS — I739 Peripheral vascular disease, unspecified: Secondary | ICD-10-CM

## 2014-08-11 DIAGNOSIS — R0989 Other specified symptoms and signs involving the circulatory and respiratory systems: Secondary | ICD-10-CM

## 2014-08-11 DIAGNOSIS — I493 Ventricular premature depolarization: Secondary | ICD-10-CM

## 2014-08-11 DIAGNOSIS — Z136 Encounter for screening for cardiovascular disorders: Secondary | ICD-10-CM

## 2014-08-11 DIAGNOSIS — I10 Essential (primary) hypertension: Secondary | ICD-10-CM

## 2014-08-11 MED ORDER — ATORVASTATIN CALCIUM 40 MG PO TABS: 40.0000 mg | ORAL_TABLET | Freq: Every day | ORAL | Status: DC

## 2014-08-11 NOTE — Patient Instructions (Signed)
Your physician wants you to follow-up in: 6 months with Dr Bryna Colander will receive a reminder letter in the mail two months in advance. If you don't receive a letter, please call our office to schedule the follow-up appointment.    INCREASE Atorvastatin to 40 mg daily   Your physician has requested that you have a carotid duplex. This test is an ultrasound of the carotid arteries in your neck. It looks at blood flow through these arteries that supply the brain with blood. Allow one hour for this exam. There are no restrictions or special instructions.      Thank you for choosing [facility] !

## 2014-08-11 NOTE — Progress Notes (Signed)
Patient ID: Caitlin Yates, female   DOB: 10-04-1940, 74 y.o.   MRN: [id number]     Clinical Summary Ms. Kiernan is a 74 y.o.female seen today as a new patient for the following medical problems.  1. PAD - previously followed in [place] for her PAD - reports prior history of claudication. She states she had a cath around 2011 or 2012, and she believes a tent placed at [facility]. Since that time symptoms have resolved. Records are not available at this time.  - denies any claudication symptoms, no foot sores - compliant with meds. She has been on plavix since leg procedure   2. DM2 - followed by pcp  3. HTN - compliant with meds  4. 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 Diabetes mellitus without complication   . Hypertension   . Hypercholesterolemia   . GERD (gastroesophageal reflux disease)      No Known Allergies   Current Outpatient Prescriptions  Medication Sig Dispense Refill  . aspirin EC 81 MG tablet Take 81 mg by mouth every morning.    Marland Kitchen atenolol-chlorthalidone (TENORETIC) 50-25 MG per tablet Take 1 tablet by mouth every morning.    Marland Kitchen atorvastatin (LIPITOR) 20 MG tablet Take 20 mg by mouth every morning.    . ciprofloxacin (CIPRO) 500 MG tablet Take 1 tablet by mouth 2 (two) times daily. Starting 09/18/2013 x 7 days.    . clopidogrel (PLAVIX) 75 MG tablet Take 75 mg by mouth every morning.    . glyBURIDE (DIABETA) 5 MG tablet Take 10 mg by mouth 2 (two) times daily with a meal.    . HYDROcodone-acetaminophen (NORCO/VICODIN) 5-325 MG per tablet Take 1 tablet by mouth every 6 (six) hours as needed for moderate pain. Starting 09/16/2013.    Marland Kitchen lisinopril (PRINIVIL,ZESTRIL) 5 MG tablet Take 5 mg by mouth daily.    . Multiple Vitamins-Minerals (CENTURY MATURE ADULT FORMULA PO) Take 1 tablet by mouth every morning.    Marland Kitchen omeprazole (PRILOSEC) 20 MG capsule Take  20 mg by mouth daily.    . potassium chloride SA (K-DUR,KLOR-CON) 20 MEQ tablet Take 20 mEq by mouth daily.    . predniSONE (DELTASONE) 10 MG tablet Starting tomorrow 09/24/13 take 2 tablets (40 mg) BID 12 tablet 0  . sitaGLIPtan-metformin (JANUMET) 50-1000 MG per tablet Take 1 tablet by mouth 2 (two) times daily.    . sucralfate (CARAFATE) 1 G tablet Take 1 g by mouth 4 (four) times daily.     . traMADol (ULTRAM) 50 MG tablet Take 50 mg by mouth 2 (two) times daily.     No current facility-administered medications for this visit.     Past Surgical History  Procedure Laterality Date  . Vascular stent right leg       No Known Allergies    Family History  Problem Relation Age of Onset  . Colon cancer Neg Hx      Social History Ms. Helfand reports that she has never smoked. She does not have any smokeless tobacco history on file. Ms. Feigel reports that she does not drink alcohol.   Review of Systems CONSTITUTIONAL: No weight loss, fever, chills, weakness or fatigue.  HEENT: Eyes: No visual loss, blurred vision, double vision or yellow sclerae.No hearing loss, sneezing, congestion, runny nose or sore throat.  SKIN: No rash or itching.  CARDIOVASCULAR: per HPI RESPIRATORY: No  shortness of breath, cough or sputum.  GASTROINTESTINAL: No anorexia, nausea, vomiting or diarrhea. No abdominal pain or blood.  GENITOURINARY: No burning on urination, no polyuria NEUROLOGICAL: No headache, dizziness, syncope, paralysis, ataxia, numbness or tingling in the extremities. No change in bowel or bladder control.  MUSCULOSKELETAL: No muscle, back pain, joint pain or stiffness.  LYMPHATICS: No enlarged nodes. No history of splenectomy.  PSYCHIATRIC: No history of depression or anxiety.  ENDOCRINOLOGIC: No reports of sweating, cold or heat intolerance. No polyuria or polydipsia.  Marland Kitchen   Physical Examination Filed Vitals:   08/11/14 0829  BP: 132/64  Pulse: 79   Filed Vitals:   08/11/14  0829  Height: 5\' 2"  (1.575 m)  Weight: 175 lb (79.379 kg)    Gen: resting comfortably, no acute distress HEENT: no scleral icterus, pupils equal round and reactive, no palptable cervical adenopathy,  CV: RRR, no m/r/g, no JVD. Bilateral carotid bruits R>L Resp: Clear to auscultation bilaterally GI: abdomen is soft, non-tender, non-distended, normal bowel sounds, no hepatosplenomegaly MSK: extremities are warm, no edema.  Skin: warm, no rash Neuro:  no focal deficits Psych: appropriate affect   Diagnostic Studies  07/2014 ABI FINDINGS: Right ABI: Non calculable due to vascular noncompressibility  Left ABI: 1.66, probably elevated secondary to vascular calcification limiting compressibility  Right Lower Extremity: There is at biphasic waveform to the popliteal level. Monophasic waveforms distally.  Left Lower Extremity: Biphasic waveform through the popliteal artery, monophasic distally.  Pulse volume recording is relatively symmetric in amplitude at the ankle and metatarsal level.  IMPRESSION: 1. Lower extremity arterial occlusive disease, with limited evaluation secondary to vascular noncompressibility. Should the patient fail conservative treatment, consider CTA runoff to better define the site and nature of arterial occlusive disease and delineate treatment options.   Assessment and Plan  1. PAD - will request records from [facility] regarding her prior procedure - no current symptoms - continue current meds - recent ABIs noncompressible  2. HTN - at goal, continue current meds including ACE-I in setting of DM2  3. Hyperlipidemia - given her DM2 should be on at least moderate strength statin, will start atorva 40mg  daily. Once records available from [place], if truly symptomatic PAD would change to 80mg  daily.   4. PVCs - occasional PVCs noted on EKG, denies any symptoms - continue to follow  5. Carotid bruit - obtain carotid US  F/u 6  months  Arnoldo Lenis, M.D.

## 2014-08-12 ENCOUNTER — Telehealth: Payer: Self-pay | Admitting: Cardiology

## 2014-08-12 NOTE — Telephone Encounter (Signed)
Called PT and clarified her dosage

## 2014-08-12 NOTE — Telephone Encounter (Signed)
pls call pt about updating her med list and about how much Lipitor she should be taking

## 2014-08-27 ENCOUNTER — Ambulatory Visit (HOSPITAL_COMMUNITY)
Admission: RE | Admit: 2014-08-27 | Discharge: 2014-08-27 | Disposition: A | Discharge status: Home / Self Care | Payer: Medicare Other | Source: Ambulatory Visit | Attending: Cardiology | Admitting: Cardiology

## 2014-08-27 DIAGNOSIS — R0989 Other specified symptoms and signs involving the circulatory and respiratory systems: Secondary | ICD-10-CM | POA: Diagnosis present

## 2014-08-27 DIAGNOSIS — I6521 Occlusion and stenosis of right carotid artery: Secondary | ICD-10-CM | POA: Diagnosis not present

## 2015-02-23 ENCOUNTER — Encounter: Payer: Self-pay | Admitting: Cardiology

## 2015-02-23 ENCOUNTER — Ambulatory Visit (INDEPENDENT_AMBULATORY_CARE_PROVIDER_SITE_OTHER): Payer: Medicare Other | Admitting: Cardiology

## 2015-02-23 VITALS — BP 142/88 | HR 72 | Ht 62.0 in | Wt 175.0 lb

## 2015-02-23 DIAGNOSIS — I2510 Atherosclerotic heart disease of native coronary artery without angina pectoris: Secondary | ICD-10-CM | POA: Diagnosis not present

## 2015-02-23 DIAGNOSIS — I739 Peripheral vascular disease, unspecified: Secondary | ICD-10-CM

## 2015-02-23 DIAGNOSIS — Z79899 Other long term (current) drug therapy: Secondary | ICD-10-CM

## 2015-02-23 DIAGNOSIS — I6523 Occlusion and stenosis of bilateral carotid arteries: Secondary | ICD-10-CM

## 2015-02-23 DIAGNOSIS — I10 Essential (primary) hypertension: Secondary | ICD-10-CM | POA: Diagnosis not present

## 2015-02-23 DIAGNOSIS — E785 Hyperlipidemia, unspecified: Secondary | ICD-10-CM

## 2015-02-23 MED ORDER — LISINOPRIL 10 MG PO TABS: 10.0000 mg | ORAL_TABLET | Freq: Every day | ORAL | Status: DC

## 2015-02-23 MED ORDER — ATORVASTATIN CALCIUM 80 MG PO TABS: 80.0000 mg | ORAL_TABLET | Freq: Every day | ORAL | Status: DC

## 2015-02-23 NOTE — Patient Instructions (Signed)
Medication Instructions:  INCREASE ATORVASTATIN 80 MG DAILY INCREASE LISINOPRIL 10 MG DAILY   Labwork: Your physician recommends that you return for lab work in: [place]  BMET    Testing/Procedures: NONE  Follow-Up: Your physician wants you to follow-up in: [place] DR. BRANCH. You will receive a reminder letter in the mail two months in advance. If you don't receive a letter, please call our office to schedule the follow-up appointment.   Any Other Special Instructions Will Be Listed Below (If Applicable).     If you need a refill on your cardiac medications before your next appointment, please call your pharmacy.

## 2015-02-23 NOTE — Progress Notes (Signed)
Patient ID: Caitlin Yates, female   DOB: [birth year], 75 y.o.   MRN: [id number]     Clinical Summary Caitlin Yates is a 75 y.o.female seen today for follow up of the following medical problems.   1. PAD - previously followed in [place] for her PAD - reports prior history of claudication. She states she had a cath around 2011 or 2012, and she believes a tent placed at [facility]. Since that time symptoms have resolved.  - from records available appears she did have cath [place] 10/2010 with Left SFA with flap and 50-60% disease with 30% mid disease and 75% disease just prox to SFA. Occluded PTA, peroneal 75%. Prox right SFA 75%, followed by multiple tandem lesions 50% and more distal 70-75%. Occluded PTA, ATA and peroneal 60-70%.  - she had left common iliac stenting and right atherectomy of SFA with stenting.   - denies any recent claudication pain. No sores on feet.    2. DM2 - followed by pcp  3. HTN - compliant with meds  4. Hyperlipidemia - compliant with statin - 03/2014 TC 183 TG 200 HDL 47 LDL 96  5. Carotid stenosis - no recent neuro symptoms - [place] 08/2014 with only mild stenosis  6. 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 Diabetes mellitus without complication   . Hypertension   . Hypercholesterolemia   . GERD (gastroesophageal reflux disease)      No Known Allergies   Current Outpatient Prescriptions  Medication Sig Dispense Refill  . aspirin EC 81 MG tablet Take 81 mg by mouth every morning.    Marland Kitchen atenolol-chlorthalidone (TENORETIC) 50-25 MG per tablet Take 1 tablet by mouth every morning.    Marland Kitchen atorvastatin (LIPITOR) 40 MG tablet Take 1 tablet (40 mg total) by mouth daily. 90 tablet 3  . clopidogrel (PLAVIX) 75 MG tablet Take 75 mg by mouth every morning.    Marland Kitchen DEXILANT 60 MG capsule Take 60 mg by mouth daily.     Marland Kitchen glyBURIDE (DIABETA) 5 MG  tablet Take 10 mg by mouth 2 (two) times daily with a meal.    . HYDROcodone-acetaminophen (NORCO/VICODIN) 5-325 MG per tablet Take 1 tablet by mouth every 6 (six) hours as needed for moderate pain. Starting 09/16/2013.    Marland Kitchen lisinopril (PRINIVIL,ZESTRIL) 5 MG tablet Take 5 mg by mouth daily.    . meloxicam (MOBIC) 7.5 MG tablet Take 7.5 mg by mouth daily.     . Multiple Vitamins-Minerals (CENTURY MATURE ADULT FORMULA PO) Take 1 tablet by mouth every morning.    . potassium chloride SA (K-DUR,KLOR-CON) 20 MEQ tablet Take 20 mEq by mouth daily.    . sitaGLIPtan-metformin (JANUMET) 50-1000 MG per tablet Take 1 tablet by mouth 2 (two) times daily.    . sucralfate (CARAFATE) 1 G tablet Take 1 g by mouth 4 (four) times daily.     . traMADol (ULTRAM) 50 MG tablet Take 50 mg by mouth 2 (two) times daily.     No current facility-administered medications for this visit.     Past Surgical History  Procedure Laterality Date  . Vascular stent right leg       No Known Allergies    Family History  Problem Relation Age of Onset  . Colon cancer Neg Hx      Social History Caitlin Yates reports that she  has never smoked. She does not have any smokeless tobacco history on file. Caitlin Yates reports that she does not drink alcohol.   Review of Systems CONSTITUTIONAL: No weight loss, fever, chills, weakness or fatigue.  HEENT: Eyes: No visual loss, blurred vision, double vision or yellow sclerae.No hearing loss, sneezing, congestion, runny nose or sore throat.  SKIN: No rash or itching.  CARDIOVASCULAR: per HPI RESPIRATORY: No shortness of breath, cough or sputum.  GASTROINTESTINAL: No anorexia, nausea, vomiting or diarrhea. No abdominal pain or blood.  GENITOURINARY: No burning on urination, no polyuria NEUROLOGICAL: No headache, dizziness, syncope, paralysis, ataxia, numbness or tingling in the extremities. No change in bowel or bladder control.  MUSCULOSKELETAL: No muscle, back pain, joint pain or  stiffness.  LYMPHATICS: No enlarged nodes. No history of splenectomy.  PSYCHIATRIC: No history of depression or anxiety.  ENDOCRINOLOGIC: No reports of sweating, cold or heat intolerance. No polyuria or polydipsia.  Marland Kitchen   Physical Examination Filed Vitals:   02/23/15 1036  BP: 142/88  Pulse: 72   Filed Vitals:   02/23/15 1036  Height: 5\' 2"  (1.575 m)  Weight: 175 lb (79.379 kg)    Gen: resting comfortably, no acute distress HEENT: no scleral icterus, pupils equal round and reactive, no palptable cervical adenopathy,  CV: RRR, no m/r/g, no jvd Resp: Clear to auscultation bilaterally GI: abdomen is soft, non-tender, non-distended, normal bowel sounds, no hepatosplenomegaly MSK: extremities are warm, no edema.  Skin: warm, no rash Neuro:  no focal deficits Psych: appropriate affect   Diagnostic Studies 07/2014 ABI FINDINGS: Right ABI: Non calculable due to vascular noncompressibility  Left ABI: 1.66, probably elevated secondary to vascular calcification limiting compressibility  Right Lower Extremity: There is at biphasic waveform to the popliteal level. Monophasic waveforms distally.  Left Lower Extremity: Biphasic waveform through the popliteal artery, monophasic distally.  Pulse volume recording is relatively symmetric in amplitude at the ankle and metatarsal level.  IMPRESSION: 1. Lower extremity arterial occlusive disease, with limited evaluation secondary to vascular noncompressibility. Should the patient fail conservative treatment, consider CTA runoff to better define the site and nature of arterial occlusive disease and delineate treatment options.  10/2010 Cath [place] LM patent, LAD patent, LCX 30% prox, OM1 40%, RCA 50-% proximal  08/2014 Carotid US IMPRESSION: 1. Mild (1-49%) stenosis proximal right internal carotid artery secondary to mild smooth heterogeneous atherosclerotic plaque. 2. Trace focal atherosclerotic plaque in the left internal  carotid artery without evidence of stenosis. 3. Vertebral arteries are patent with normal antegrade flow. 4. Intermittent irregularity of the cardiac rhythm noted incidentally. Does the patient have a history of intermittent atrial fibrillation?   Assessment and Plan   1. PAD - she denies any recent claudication, no sores on her feet - continue current medical therapy.   2. HTN - above goal, we will increase lisinopril to 10mg  daily. Repeat BMET in 2 weeks.   3. Hyperlipidemia - given her history of PAD and CAD, will change to high dose statin with atorva 80mg  daily.   4. Carotid stenosis - mild disease by recent US, no neuro symptoms. Continue to monitor  5. CAD - nonobsructive CAD by cath in [place] in 2012. No cardiac symptoms, continue risk factor modification.   F/u 6 months     Arnoldo Lenis, M.D.

## 2015-03-10 LAB — BASIC METABOLIC PANEL
BUN: 26 mg/dL — ABNORMAL HIGH (ref 7–25)
CHLORIDE: 101 mmol/L (ref 98–110)
CO2: 26 mmol/L (ref 20–31)
CREATININE: 1.2 mg/dL — AB (ref 0.60–0.93)
Calcium: 9.2 mg/dL (ref 8.6–10.4)
GLUCOSE: 158 mg/dL — AB (ref 65–99)
Potassium: 3.8 mmol/L (ref 3.5–5.3)
Sodium: 141 mmol/L (ref 135–146)

## 2015-04-08 ENCOUNTER — Emergency Department (HOSPITAL_COMMUNITY)
Admission: EM | Admit: 2015-04-08 | Discharge: 2015-04-08 | Disposition: A | Discharge status: Home / Self Care | Payer: Medicare Other | Attending: Emergency Medicine | Admitting: Emergency Medicine

## 2015-04-08 ENCOUNTER — Encounter (HOSPITAL_COMMUNITY): Payer: Self-pay | Admitting: Emergency Medicine

## 2015-04-08 ENCOUNTER — Emergency Department (HOSPITAL_COMMUNITY): Payer: Medicare Other

## 2015-04-08 DIAGNOSIS — N390 Urinary tract infection, site not specified: Secondary | ICD-10-CM | POA: Insufficient documentation

## 2015-04-08 DIAGNOSIS — Z79899 Other long term (current) drug therapy: Secondary | ICD-10-CM | POA: Diagnosis not present

## 2015-04-08 DIAGNOSIS — I10 Essential (primary) hypertension: Secondary | ICD-10-CM | POA: Diagnosis not present

## 2015-04-08 DIAGNOSIS — E119 Type 2 diabetes mellitus without complications: Secondary | ICD-10-CM | POA: Diagnosis not present

## 2015-04-08 DIAGNOSIS — Z7984 Long term (current) use of oral hypoglycemic drugs: Secondary | ICD-10-CM | POA: Diagnosis not present

## 2015-04-08 DIAGNOSIS — K529 Noninfective gastroenteritis and colitis, unspecified: Secondary | ICD-10-CM

## 2015-04-08 DIAGNOSIS — R112 Nausea with vomiting, unspecified: Secondary | ICD-10-CM | POA: Diagnosis present

## 2015-04-08 DIAGNOSIS — Z7982 Long term (current) use of aspirin: Secondary | ICD-10-CM | POA: Diagnosis not present

## 2015-04-08 LAB — URINE MICROSCOPIC-ADD ON

## 2015-04-08 LAB — CBC
HEMATOCRIT: 38.1 % (ref 36.0–46.0)
HEMOGLOBIN: 12.3 g/dL (ref 12.0–15.0)
MCH: 29.1 pg (ref 26.0–34.0)
MCHC: 32.3 g/dL (ref 30.0–36.0)
MCV: 90.1 fL (ref 78.0–100.0)
Platelets: 346 10*3/uL (ref 150–400)
RBC: 4.23 MIL/uL (ref 3.87–5.11)
RDW: 14 % (ref 11.5–15.5)
WBC: 6.4 10*3/uL (ref 4.0–10.5)

## 2015-04-08 LAB — COMPREHENSIVE METABOLIC PANEL
ALT: 16 U/L (ref 14–54)
ANION GAP: 10 (ref 5–15)
AST: 28 U/L (ref 15–41)
Albumin: 4.4 g/dL (ref 3.5–5.0)
Alkaline Phosphatase: 44 U/L (ref 38–126)
BILIRUBIN TOTAL: 0.3 mg/dL (ref 0.3–1.2)
BUN: 18 mg/dL (ref 6–20)
CHLORIDE: 100 mmol/L — AB (ref 101–111)
CO2: 27 mmol/L (ref 22–32)
Calcium: 9.5 mg/dL (ref 8.9–10.3)
Creatinine, Ser: 1.16 mg/dL — ABNORMAL HIGH (ref 0.44–1.00)
GFR, EST AFRICAN AMERICAN: 52 mL/min — AB (ref >60)
GFR, EST NON AFRICAN AMERICAN: 45 mL/min — AB (ref >60)
Glucose, Bld: 122 mg/dL — ABNORMAL HIGH (ref 65–99)
POTASSIUM: 3.9 mmol/L (ref 3.5–5.1)
Sodium: 137 mmol/L (ref 135–145)
TOTAL PROTEIN: 7.7 g/dL (ref 6.5–8.1)

## 2015-04-08 LAB — URINALYSIS, ROUTINE W REFLEX MICROSCOPIC
GLUCOSE, UA: NEGATIVE mg/dL
KETONES UR: NEGATIVE mg/dL
NITRITE: NEGATIVE
PH: 6 (ref 5.0–8.0)
Protein, ur: NEGATIVE mg/dL
SPECIFIC GRAVITY, URINE: 1.015 (ref 1.005–1.030)

## 2015-04-08 LAB — LIPASE, BLOOD: LIPASE: 61 U/L — AB (ref 11–51)

## 2015-04-08 MED ORDER — CEPHALEXIN 500 MG PO CAPS
500.0000 mg | ORAL_CAPSULE | Freq: Once | ORAL | Status: AC
  Administered 2015-04-08: 500 mg via ORAL
  Filled 2015-04-08: qty 1

## 2015-04-08 MED ORDER — SODIUM CHLORIDE 0.9 % IV BOLUS (SEPSIS)
1000.0000 mL | Freq: Once | INTRAVENOUS | Status: AC
  Administered 2015-04-08: 1000 mL via INTRAVENOUS

## 2015-04-08 MED ORDER — CEPHALEXIN 500 MG PO CAPS: 500.0000 mg | ORAL_CAPSULE | Freq: Four times a day (QID) | ORAL | Status: DC

## 2015-04-08 MED ORDER — ONDANSETRON 4 MG PO TBDP: ORAL_TABLET | ORAL | Status: DC

## 2015-04-08 MED ORDER — ONDANSETRON HCL 4 MG/2ML IJ SOLN
4.0000 mg | Freq: Once | INTRAMUSCULAR | Status: AC
  Administered 2015-04-08: 4 mg via INTRAVENOUS
  Filled 2015-04-08: qty 2

## 2015-04-08 MED ORDER — KETOROLAC TROMETHAMINE 30 MG/ML IJ SOLN
15.0000 mg | Freq: Once | INTRAMUSCULAR | Status: AC
  Administered 2015-04-08: 15 mg via INTRAVENOUS
  Filled 2015-04-08: qty 1

## 2015-04-08 MED ORDER — TRAMADOL HCL 50 MG PO TABS: 50.0000 mg | ORAL_TABLET | Freq: Four times a day (QID) | ORAL | Status: DC | PRN

## 2015-04-08 MED ORDER — ONDANSETRON 4 MG PO TBDP
4.0000 mg | ORAL_TABLET | Freq: Once | ORAL | Status: AC | PRN
  Administered 2015-04-08: 4 mg via ORAL
  Filled 2015-04-08: qty 1

## 2015-04-08 NOTE — ED Notes (Signed)
PT states vomiting and diarrhea x2 that started at 1500 this evening. PT denies any medications prior to ED arrival.

## 2015-04-08 NOTE — ED Provider Notes (Signed)
CSN: [id number]     Arrival date & time 04/08/15  1554 History   First MD Initiated Contact with Patient 04/08/15 1744     Chief Complaint  Patient presents with  . Emesis     (Consider location/radiation/quality/duration/timing/severity/associated sxs/prior Treatment) Patient is a 75 y.o. female presenting with vomiting. The history is provided by the patient (Patient complains of vomiting and diarrhea today with some abdominal pain).  Emesis Severity:  Moderate Timing:  Constant Quality:  Bilious material Able to tolerate:  Liquids Progression:  Unchanged Chronicity:  New Recent urination:  Normal Relieved by:  Nothing Associated symptoms: diarrhea   Associated symptoms: no abdominal pain and no headaches     Past Medical History  Diagnosis Date  . Diabetes mellitus without complication ([place])   . Hypertension   . Hypercholesterolemia   . GERD (gastroesophageal reflux disease)    Past Surgical History  Procedure Laterality Date  . Vascular stent right leg     Family History  Problem Relation Age of Onset  . Colon cancer Neg Hx    Social History  Substance Use Topics  . Smoking status: Never Smoker   . Smokeless tobacco: None  . Alcohol Use: No   OB History    No data available     Review of Systems  Constitutional: Negative for appetite change and fatigue.  HENT: Negative for congestion, ear discharge and sinus pressure.   Eyes: Negative for discharge.  Respiratory: Negative for cough.   Cardiovascular: Negative for chest pain.  Gastrointestinal: Positive for nausea, vomiting and diarrhea. Negative for abdominal pain.  Genitourinary: Negative for frequency and hematuria.  Musculoskeletal: Negative for back pain.  Skin: Negative for rash.  Neurological: Negative for seizures and headaches.  Psychiatric/Behavioral: Negative for hallucinations.      Allergies  Lisinopril  Home Medications   Prior to Admission medications   Medication Sig Start Date  End Date Taking? Authorizing Provider  aspirin EC 81 MG tablet Take 81 mg by mouth every morning.   Yes Historical Provider, MD  atenolol-chlorthalidone (TENORETIC) 50-25 MG per tablet Take 1 tablet by mouth every morning.   Yes Historical Provider, MD  atorvastatin (LIPITOR) 80 MG tablet Take 1 tablet (80 mg total) by mouth daily. Patient taking differently: Take 40 mg by mouth every evening.  02/23/15  Yes Arnoldo Lenis, MD  clopidogrel (PLAVIX) 75 MG tablet Take 75 mg by mouth every morning.   Yes Historical Provider, MD  DEXILANT 60 MG capsule Take 60 mg by mouth daily.  07/16/14  Yes Historical Provider, MD  glimepiride (AMARYL) 4 MG tablet Take 4 mg by mouth daily with breakfast.  02/04/15  Yes Historical Provider, MD  meclizine (ANTIVERT) 25 MG tablet Take 25 mg by mouth every 8 (eight) hours as needed for dizziness.   Yes Historical Provider, MD  Multiple Vitamins-Minerals (CENTURY MATURE ADULT FORMULA PO) Take 1 tablet by mouth every morning.   Yes Historical Provider, MD  potassium chloride SA (K-DUR,KLOR-CON) 20 MEQ tablet Take 20 mEq by mouth daily.   Yes Historical Provider, MD  sitaGLIPtan-metformin (JANUMET) 50-1000 MG per tablet Take 1 tablet by mouth 2 (two) times daily.   Yes Historical Provider, MD  Vitamin D, Ergocalciferol, (DRISDOL) 50000 units CAPS capsule Take 50,000 Units by mouth every Wednesday.   Yes Historical Provider, MD  cephALEXin (KEFLEX) 500 MG capsule Take 1 capsule (500 mg total) by mouth 4 (four) times daily. 04/08/15   Milton Ferguson, MD  ondansetron (ZOFRAN ODT)  4 MG disintegrating tablet 4mg  ODT q4 hours prn nausea/vomit 04/08/15   Milton Ferguson, MD  traMADol (ULTRAM) 50 MG tablet Take 1 tablet (50 mg total) by mouth every 6 (six) hours as needed. 04/08/15   Milton Ferguson, MD   BP 121/65 mmHg  Pulse 80  Temp(Src) 99.3 F (37.4 C)  Resp 14  Ht 5\' 2"  (1.575 m)  Wt 174 lb (78.926 kg)  BMI 31.82 kg/m2  SpO2 97% Physical Exam  Constitutional: She is oriented  to person, place, and time. She appears well-developed.  HENT:  Head: Normocephalic.  Eyes: Conjunctivae and EOM are normal. No scleral icterus.  Neck: Neck supple. No thyromegaly present.  Cardiovascular: Normal rate and regular rhythm.  Exam reveals no gallop and no friction rub.   No murmur heard. Pulmonary/Chest: No stridor. She has no wheezes. She has no rales. She exhibits no tenderness.  Abdominal: She exhibits no distension. There is tenderness. There is no rebound.  Mild abdominal tenderness  Musculoskeletal: Normal range of motion. She exhibits no edema.  Lymphadenopathy:    She has no cervical adenopathy.  Neurological: She is oriented to person, place, and time. She exhibits normal muscle tone. Coordination normal.  Skin: No rash noted. No erythema.  Psychiatric: She has a normal mood and affect. Her behavior is normal.    ED Course  Procedures (including critical care time) Labs Review Labs Reviewed  LIPASE, BLOOD - Abnormal; Notable for the following:    Lipase 61 (*)    All other components within normal limits  COMPREHENSIVE METABOLIC PANEL - Abnormal; Notable for the following:    Chloride 100 (*)    Glucose, Bld 122 (*)    Creatinine, Ser 1.16 (*)    GFR calc non Af Amer 45 (*)    GFR calc Af Amer 52 (*)    All other components within normal limits  URINALYSIS, ROUTINE W REFLEX MICROSCOPIC (NOT AT [facility]) - Abnormal; Notable for the following:    APPearance HAZY (*)    Hgb urine dipstick SMALL (*)    Bilirubin Urine SMALL (*)    Leukocytes, UA SMALL (*)    All other components within normal limits  URINE MICROSCOPIC-ADD ON - Abnormal; Notable for the following:    Squamous Epithelial / LPF 0-5 (*)    Bacteria, UA MANY (*)    All other components within normal limits  URINE CULTURE  CBC    Imaging Review Dg Abd Acute W/chest  04/08/2015  CLINICAL DATA:  Abdominal pain, weakness, nausea, vomiting, and diarrhea starting this morning. EXAM: DG ABDOMEN ACUTE  W/ 1V CHEST COMPARISON:  CT abdomen and pelvis 08/21/2012.  Abdomen 08/18/2012. FINDINGS: Slight fibrosis in the lungs. Degenerative changes in the spine and shoulders. Normal heart size and pulmonary vascularity. No focal airspace disease or consolidation in the lungs. No blunting of costophrenic angles. No pneumothorax. Mediastinal contours appear intact. Scattered gas and stool in the colon. No small or large bowel distention. No free intra-abdominal air. No abnormal air-fluid levels. No radiopaque stones. Visualized bones appear intact. Calcifications in the pelvis consistent with phleboliths. Calcifications in the left upper quadrant may represent granulomas in the lung base or liver. IMPRESSION: No evidence of active pulmonary disease. Normal nonobstructive bowel gas pattern. Electronically Signed   By: Lucienne Capers M.D.   On: 04/08/2015 20:16   I have personally reviewed and evaluated these images and lab results as part of my medical decision-making.   EKG Interpretation None  MDM   Final diagnoses:  Gastroenteritis  UTI (lower urinary tract infection)    Patient had gastroenteritis symptoms. Patient improved with fluids and nausea medicine. Labs show mild elevation of lipase. Possible urinary tract infection also. Patient will be put on Keflex Zofran and Ultram and will follow-up with her PCP    Milton Ferguson, MD 04/08/15 2153

## 2015-04-08 NOTE — Discharge Instructions (Signed)
Drink plenty of fluids and follow-up with her doctor next week

## 2015-04-13 LAB — URINE CULTURE

## 2015-04-14 ENCOUNTER — Telehealth: Payer: Self-pay | Admitting: *Deleted

## 2015-04-14 NOTE — ED Notes (Signed)
Post ED Visit - Positive Culture Follow-up  Culture report reviewed by antimicrobial stewardship pharmacist:  []  Elenor Quinones, Pharm.D. []  Heide Guile, Pharm.D., BCPS []  Parks Neptune, Pharm.D. []  Alycia Rossetti, Pharm.D., BCPS []  [place], Pharm.D., BCPS, AAHIVP []  Legrand Como, Pharm.D., BCPS, AAHIVP []  Milus Glazier, Pharm.D. []  Rob Evette Doffing, Pharm.DGovernor Specking, Pharm D.  Positive urine  culture Treated with Cephalexin, organism sensitive to the same and no further patient follow-up is required at this time.  Harlon Flor [facility] 04/14/2015, 11:03 AM

## 2015-08-30 ENCOUNTER — Encounter: Payer: Self-pay | Admitting: Cardiology

## 2015-09-05 ENCOUNTER — Other Ambulatory Visit (HOSPITAL_COMMUNITY): Payer: Self-pay | Admitting: Internal Medicine

## 2015-09-05 DIAGNOSIS — Z1231 Encounter for screening mammogram for malignant neoplasm of breast: Secondary | ICD-10-CM

## 2015-09-21 ENCOUNTER — Ambulatory Visit (HOSPITAL_COMMUNITY)
Admission: RE | Admit: 2015-09-21 | Discharge: 2015-09-21 | Disposition: A | Discharge status: Home / Self Care | Payer: Medicare Other | Source: Ambulatory Visit | Attending: Internal Medicine | Admitting: Internal Medicine

## 2015-09-21 DIAGNOSIS — Z1231 Encounter for screening mammogram for malignant neoplasm of breast: Secondary | ICD-10-CM | POA: Insufficient documentation

## 2015-09-27 ENCOUNTER — Ambulatory Visit: Payer: Medicare Other | Admitting: Cardiology

## 2015-10-21 ENCOUNTER — Ambulatory Visit (INDEPENDENT_AMBULATORY_CARE_PROVIDER_SITE_OTHER): Payer: Medicare Other | Admitting: Cardiology

## 2015-10-21 ENCOUNTER — Encounter: Payer: Self-pay | Admitting: Cardiology

## 2015-10-21 VITALS — BP 164/80 | HR 90 | Ht 62.0 in | Wt 168.0 lb

## 2015-10-21 DIAGNOSIS — I739 Peripheral vascular disease, unspecified: Secondary | ICD-10-CM | POA: Diagnosis not present

## 2015-10-21 DIAGNOSIS — I2510 Atherosclerotic heart disease of native coronary artery without angina pectoris: Secondary | ICD-10-CM

## 2015-10-21 DIAGNOSIS — E782 Mixed hyperlipidemia: Secondary | ICD-10-CM | POA: Diagnosis not present

## 2015-10-21 DIAGNOSIS — I6523 Occlusion and stenosis of bilateral carotid arteries: Secondary | ICD-10-CM

## 2015-10-21 DIAGNOSIS — I10 Essential (primary) hypertension: Secondary | ICD-10-CM

## 2015-10-21 NOTE — Patient Instructions (Addendum)
Medication Instructions:  Your physician recommends that you continue on your current medications as directed. Please refer to the Current Medication list given to you today.   Labwork: I WILL REQUEST LABS FROM PCP  Testing/Procedures: Your physician has requested that you have an ankle brachial index (ABI). During this test an ultrasound and blood pressure cuff are used to evaluate the arteries that supply the arms and legs with blood. Allow thirty minutes for this exam. There are no restrictions or special instructions.  AORTOILIAC DUPLEX ULTRASOUND    Follow-Up: Your physician wants you to follow-up in: 1 YEAR .  You will receive a reminder letter in the mail two months in advance. If you don't receive a letter, please call our office to schedule the follow-up appointment.   Any Other Special Instructions Will Be Listed Below (If Applicable).  PLEASE KEEP A BLOOD PRESSURE LOG AND CALL us WITH THE RESULTS ON Monday    If you need a refill on your cardiac medications before your next appointment, please call your pharmacy.

## 2015-10-21 NOTE — Progress Notes (Signed)
Clinical Summary Caitlin Yates is a 75 y.o.female seen today for follow up of the following medical problems.   1. PAD - previously followed in [place] for her PAD - reports prior history of claudication. She states she had a cath around 2011 or 2012, and she believes a tent placed at [facility]. Since that time symptoms have resolved.  - from records available appears she did have cath [place] 10/2010 with Left SFA with flap and 50-60% disease with 30% mid disease and 75% disease just prox to SFA. Occluded PTA, peroneal 75%. Prox right SFA 75%, followed by multiple tandem lesions 50% and more distal 70-75%. Occluded PTA, ATA and peroneal 60-70%.  - she had left common iliac stenting and right atherectomy of SFA with stenting.    - no recent claudication, though can have a "tired feeling" in legs at times.    2. DM2 - followed by pcp  3. HTN - angioedema on ACE-I, she is now off - she reports bp typically controlled at home.   4. Hyperlipidemia - compliant with statin  5. Carotid stenosis - no recent neuro symptoms since last visit - [place] 08/2014 with only mild stenosis  6. CAD - nonobstructive CAD by cath in 201  - denies any recent chest pain, no SOB or DOE   Past Medical History:  Diagnosis Date  . Diabetes mellitus without complication ([place])   . GERD (gastroesophageal reflux disease)   . Hypercholesterolemia   . Hypertension      Allergies  Allergen Reactions  . Lisinopril Swelling    Tongue swelling     Current Outpatient Prescriptions  Medication Sig Dispense Refill  . aspirin EC 81 MG tablet Take 81 mg by mouth every morning.    Marland Kitchen atenolol-chlorthalidone (TENORETIC) 50-25 MG per tablet Take 1 tablet by mouth every morning.    Marland Kitchen atorvastatin (LIPITOR) 80 MG tablet Take 1 tablet (80 mg total) by mouth daily. (Patient taking differently: Take 40 mg by mouth every evening. ) 90 tablet 3  . cephALEXin (KEFLEX) 500 MG capsule Take 1 capsule  (500 mg total) by mouth 4 (four) times daily. 28 capsule 0  . clopidogrel (PLAVIX) 75 MG tablet Take 75 mg by mouth every morning.    Marland Kitchen DEXILANT 60 MG capsule Take 60 mg by mouth daily.     Marland Kitchen glimepiride (AMARYL) 4 MG tablet Take 4 mg by mouth daily with breakfast.     . meclizine (ANTIVERT) 25 MG tablet Take 25 mg by mouth every 8 (eight) hours as needed for dizziness.    . Multiple Vitamins-Minerals (CENTURY MATURE ADULT FORMULA PO) Take 1 tablet by mouth every morning.    . ondansetron (ZOFRAN ODT) 4 MG disintegrating tablet 4mg  ODT q4 hours prn nausea/vomit 12 tablet 0  . potassium chloride SA (K-DUR,KLOR-CON) 20 MEQ tablet Take 20 mEq by mouth daily.    . sitaGLIPtan-metformin (JANUMET) 50-1000 MG per tablet Take 1 tablet by mouth 2 (two) times daily.    . traMADol (ULTRAM) 50 MG tablet Take 1 tablet (50 mg total) by mouth every 6 (six) hours as needed. 20 tablet 0  . Vitamin D, Ergocalciferol, (DRISDOL) 50000 units CAPS capsule Take 50,000 Units by mouth every Wednesday.     No current facility-administered medications for this visit.      Past Surgical History:  Procedure Laterality Date  . vascular stent right leg       Allergies  Allergen Reactions  . Lisinopril Swelling  Tongue swelling      Family History  Problem Relation Age of Onset  . Colon cancer Neg Hx      Social History Ms. Shimmin reports that she has never smoked. She does not have any smokeless tobacco history on file. Ms. Shergill reports that she does not drink alcohol.   Review of Systems CONSTITUTIONAL: No weight loss, fever, chills, weakness or fatigue.  HEENT: Eyes: No visual loss, blurred vision, double vision or yellow sclerae.No hearing loss, sneezing, congestion, runny nose or sore throat.  SKIN: No rash or itching.  CARDIOVASCULAR: per HPI RESPIRATORY: No shortness of breath, cough or sputum.  GASTROINTESTINAL: No anorexia, nausea, vomiting or diarrhea. No abdominal pain or blood.    GENITOURINARY: No burning on urination, no polyuria NEUROLOGICAL: No headache, dizziness, syncope, paralysis, ataxia, numbness or tingling in the extremities. No change in bowel or bladder control.  MUSCULOSKELETAL: No muscle, back pain, joint pain or stiffness.  LYMPHATICS: No enlarged nodes. No history of splenectomy.  PSYCHIATRIC: No history of depression or anxiety.  ENDOCRINOLOGIC: No reports of sweating, cold or heat intolerance. No polyuria or polydipsia.  Marland Kitchen   Physical Examination Vitals:   10/21/15 1012  BP: (!) 164/80  Pulse: 90   Vitals:   10/21/15 1012  Weight: 168 lb (76.2 kg)  Height: 5\' 2"  (1.575 m)    Gen: resting comfortably, no acute distress HEENT: no scleral icterus, pupils equal round and reactive, no palptable cervical adenopathy,  CV: RRR, no m/r/,g no jvd Resp: Clear to auscultation bilaterally GI: abdomen is soft, non-tender, non-distended, normal bowel sounds, no hepatosplenomegaly MSK: extremities are warm, no edema.  Skin: warm, no rash Neuro:  no focal deficits Psych: appropriate affect   Diagnostic Studies  07/2014 ABI FINDINGS: Right ABI: Non calculable due to vascular noncompressibility  Left ABI: 1.66, probably elevated secondary to vascular calcification limiting compressibility  Right Lower Extremity: There is at biphasic waveform to the popliteal level. Monophasic waveforms distally.  Left Lower Extremity: Biphasic waveform through the popliteal artery, monophasic distally.  Pulse volume recording is relatively symmetric in amplitude at the ankle and metatarsal level.  IMPRESSION: 1. Lower extremity arterial occlusive disease, with limited evaluation secondary to vascular noncompressibility. Should the patient fail conservative treatment, consider CTA runoff to better define the site and nature of arterial occlusive disease and delineate treatment options.  10/2010 Cath [place] LM patent, LAD patent, LCX 30% prox,  OM1 40%, RCA 50-% proximal  08/2014 Carotid US IMPRESSION: 1. Mild (1-49%) stenosis proximal right internal carotid artery secondary to mild smooth heterogeneous atherosclerotic plaque. 2. Trace focal atherosclerotic plaque in the left internal carotid artery without evidence of stenosis. 3. Vertebral arteries are patent with normal antegrade flow. 4. Intermittent irregularity of the cardiac rhythm noted incidentally. Does the patient have a history of intermittent atrial fibrillation?    Assessment and Plan  1. PAD - she denies any recent claudication, can have some occasional leg fatigue - prior intervention several years ago, has not had repeat imaging since that time - we will obtain aortoiliac duplex US, LEA duplex, and ABI.   2. HTN - above goal, she reports home numbers are at goal - she will call us Monday with bp numbers from the weekend to decide if med changes are neccesary.   3. Hyperlipidemia - given her history of PAD and CAD we will conitnue high dose statin.   4. Carotid stenosis - mild disease by recent US - Continue to monitor  5. CAD -  nonobsructive CAD by cath in [place] in 2012.  No recent cardiac symptoms, we will continue risk factor modification.   F/u 1 year      Arnoldo Lenis, M.D.

## 2015-10-24 ENCOUNTER — Other Ambulatory Visit: Payer: Self-pay | Admitting: Cardiology

## 2015-10-24 ENCOUNTER — Telehealth: Payer: Self-pay | Admitting: Cardiology

## 2015-10-24 DIAGNOSIS — I739 Peripheral vascular disease, unspecified: Secondary | ICD-10-CM

## 2015-10-24 NOTE — Telephone Encounter (Signed)
Pt states she feels great, will forward BP to Dr Harl Bowie

## 2015-10-24 NOTE — Telephone Encounter (Signed)
Patient was told to call with BP readings from weekend.  Saturday - 127/67  Sunday - 133/60  Monday - 132-62

## 2015-10-25 NOTE — Telephone Encounter (Signed)
Home bp's look good, no med changes   Zandra Abts MD

## 2015-10-25 NOTE — Telephone Encounter (Signed)
Pt notified of Dr Branch's message 

## 2015-11-01 ENCOUNTER — Ambulatory Visit: Payer: Medicare Other

## 2015-11-01 DIAGNOSIS — I739 Peripheral vascular disease, unspecified: Secondary | ICD-10-CM

## 2015-11-03 LAB — VAS US LOWER EXTREMITY ARTERIAL DUPLEX
LATIBDISTSYS: 64 cm/s
LPTIBDISTSYS: 54 cm/s
LSFPPSV: -171 cm/s
Left popliteal prox sys PSV: 40 cm/s
Left super femoral mid sys PSV: -157 cm/s
RATIBDISTSYS: 35 cm/s
RPERPSV: 36 cm/s
RPOPDPSV: 104 cm/s
RTIBDISTSYS: 44 cm/s
Right popliteal prox sys PSV: 66 cm/s
Right super femoral dist sys PSV: -56 cm/s
Right super femoral prox sys PSV: 232 cm/s

## 2015-11-08 ENCOUNTER — Other Ambulatory Visit: Payer: Self-pay | Admitting: Cardiology

## 2015-11-08 MED ORDER — ATORVASTATIN CALCIUM 40 MG PO TABS: 40.0000 mg | ORAL_TABLET | Freq: Every day | ORAL | 2 refills | Status: DC

## 2015-11-10 ENCOUNTER — Telehealth: Payer: Self-pay

## 2015-11-10 NOTE — Telephone Encounter (Signed)
Called pt. no answer- will mail letter.

## 2015-11-10 NOTE — Telephone Encounter (Signed)
-----   Message from Massie Maroon, [place] sent at 11/09/2015  4:44 PM EDT -----   ----- Message ----- From: Arnoldo Lenis, MD Sent: 11/07/2015   1:34 PM To: Staci T Ashworth, CMA  LE [place] does show some moderate blockages but overall her legs are gatting enough blood. Continue to monitor  Zandra Abts MD

## 2015-11-14 ENCOUNTER — Telehealth: Payer: Self-pay | Admitting: Cardiology

## 2015-11-14 NOTE — Telephone Encounter (Signed)
Called concerning the letter she got in the mail regarding her test results, please give her a call back @[phone]

## 2015-11-14 NOTE — Telephone Encounter (Signed)
Pt responded to letter sent with LE dopplers,will f/u with Dr Harl Bowie in 1 yr

## 2016-07-27 ENCOUNTER — Ambulatory Visit (HOSPITAL_COMMUNITY)
Admission: RE | Admit: 2016-07-27 | Discharge: 2016-07-27 | Disposition: A | Discharge status: Home / Self Care | Payer: Medicare Other | Source: Ambulatory Visit | Attending: Nurse Practitioner | Admitting: Nurse Practitioner

## 2016-07-27 ENCOUNTER — Other Ambulatory Visit (HOSPITAL_COMMUNITY): Payer: Self-pay | Admitting: Nurse Practitioner

## 2016-07-27 DIAGNOSIS — I7090 Unspecified atherosclerosis: Secondary | ICD-10-CM | POA: Insufficient documentation

## 2016-07-27 DIAGNOSIS — M25551 Pain in right hip: Secondary | ICD-10-CM | POA: Diagnosis present

## 2016-09-11 ENCOUNTER — Other Ambulatory Visit (HOSPITAL_COMMUNITY): Payer: Self-pay | Admitting: Nurse Practitioner

## 2016-09-11 DIAGNOSIS — Z1231 Encounter for screening mammogram for malignant neoplasm of breast: Secondary | ICD-10-CM

## 2016-09-16 ENCOUNTER — Inpatient Hospital Stay
Admission: EM | Admit: 2016-09-16 | Discharge: 2016-09-18 | DRG: 683 | Disposition: A | Discharge status: Home / Self Care | Payer: Medicare Other | Attending: Internal Medicine | Admitting: Internal Medicine

## 2016-09-16 DIAGNOSIS — Z7984 Long term (current) use of oral hypoglycemic drugs: Secondary | ICD-10-CM

## 2016-09-16 DIAGNOSIS — N390 Urinary tract infection, site not specified: Secondary | ICD-10-CM | POA: Diagnosis present

## 2016-09-16 DIAGNOSIS — E860 Dehydration: Secondary | ICD-10-CM | POA: Diagnosis not present

## 2016-09-16 DIAGNOSIS — Z95828 Presence of other vascular implants and grafts: Secondary | ICD-10-CM

## 2016-09-16 DIAGNOSIS — I248 Other forms of acute ischemic heart disease: Secondary | ICD-10-CM | POA: Diagnosis present

## 2016-09-16 DIAGNOSIS — I10 Essential (primary) hypertension: Secondary | ICD-10-CM | POA: Diagnosis not present

## 2016-09-16 DIAGNOSIS — I739 Peripheral vascular disease, unspecified: Secondary | ICD-10-CM | POA: Diagnosis not present

## 2016-09-16 DIAGNOSIS — E1151 Type 2 diabetes mellitus with diabetic peripheral angiopathy without gangrene: Secondary | ICD-10-CM | POA: Diagnosis present

## 2016-09-16 DIAGNOSIS — N179 Acute kidney failure, unspecified: Principal | ICD-10-CM | POA: Diagnosis present

## 2016-09-16 DIAGNOSIS — K219 Gastro-esophageal reflux disease without esophagitis: Secondary | ICD-10-CM | POA: Diagnosis not present

## 2016-09-16 DIAGNOSIS — R531 Weakness: Secondary | ICD-10-CM | POA: Diagnosis present

## 2016-09-16 DIAGNOSIS — E785 Hyperlipidemia, unspecified: Secondary | ICD-10-CM | POA: Diagnosis present

## 2016-09-16 DIAGNOSIS — R7989 Other specified abnormal findings of blood chemistry: Secondary | ICD-10-CM

## 2016-09-16 DIAGNOSIS — D638 Anemia in other chronic diseases classified elsewhere: Secondary | ICD-10-CM | POA: Diagnosis present

## 2016-09-16 DIAGNOSIS — Z888 Allergy status to other drugs, medicaments and biological substances status: Secondary | ICD-10-CM | POA: Diagnosis not present

## 2016-09-16 DIAGNOSIS — R778 Other specified abnormalities of plasma proteins: Secondary | ICD-10-CM

## 2016-09-16 DIAGNOSIS — Z79899 Other long term (current) drug therapy: Secondary | ICD-10-CM | POA: Diagnosis not present

## 2016-09-16 DIAGNOSIS — Z7982 Long term (current) use of aspirin: Secondary | ICD-10-CM

## 2016-09-16 DIAGNOSIS — Z7902 Long term (current) use of antithrombotics/antiplatelets: Secondary | ICD-10-CM | POA: Diagnosis not present

## 2016-09-16 DIAGNOSIS — D649 Anemia, unspecified: Secondary | ICD-10-CM

## 2016-09-16 DIAGNOSIS — N289 Disorder of kidney and ureter, unspecified: Secondary | ICD-10-CM

## 2016-09-16 LAB — CBC WITH DIFFERENTIAL/PLATELET
BASOS PCT: 1 %
Basophils Absolute: 0.1 10*3/uL (ref 0–0.1)
EOS PCT: 2 %
Eosinophils Absolute: 0.2 10*3/uL (ref 0–0.7)
HEMATOCRIT: 26.3 % — AB (ref 35.0–47.0)
Hemoglobin: 9.4 g/dL — ABNORMAL LOW (ref 12.0–16.0)
Lymphocytes Relative: 25 %
Lymphs Abs: 2.4 10*3/uL (ref 1.0–3.6)
MCH: 31.2 pg (ref 26.0–34.0)
MCHC: 35.8 g/dL (ref 32.0–36.0)
MCV: 87.3 fL (ref 80.0–100.0)
MONO ABS: 0.9 10*3/uL (ref 0.2–0.9)
MONOS PCT: 10 %
NEUTROS ABS: 5.9 10*3/uL (ref 1.4–6.5)
Neutrophils Relative %: 62 %
Platelets: 515 10*3/uL — ABNORMAL HIGH (ref 150–440)
RBC: 3.01 MIL/uL — ABNORMAL LOW (ref 3.80–5.20)
RDW: 14.2 % (ref 11.5–14.5)
WBC: 9.4 10*3/uL (ref 3.6–11.0)

## 2016-09-16 LAB — COMPREHENSIVE METABOLIC PANEL
ALBUMIN: 2.9 g/dL — AB (ref 3.5–5.0)
ALT: 22 U/L (ref 14–54)
ANION GAP: 14 (ref 5–15)
AST: 25 U/L (ref 15–41)
Alkaline Phosphatase: 51 U/L (ref 38–126)
BUN: 43 mg/dL — ABNORMAL HIGH (ref 6–20)
CO2: 25 mmol/L (ref 22–32)
Calcium: 9.4 mg/dL (ref 8.9–10.3)
Chloride: 97 mmol/L — ABNORMAL LOW (ref 101–111)
Creatinine, Ser: 2.12 mg/dL — ABNORMAL HIGH (ref 0.44–1.00)
GFR calc Af Amer: 25 mL/min — ABNORMAL LOW (ref >60)
GFR calc non Af Amer: 21 mL/min — ABNORMAL LOW (ref >60)
GLUCOSE: 123 mg/dL — AB (ref 65–99)
Potassium: 3.9 mmol/L (ref 3.5–5.1)
Sodium: 136 mmol/L (ref 135–145)
Total Bilirubin: 0.6 mg/dL (ref 0.3–1.2)
Total Protein: 7.7 g/dL (ref 6.5–8.1)

## 2016-09-16 LAB — TROPONIN I: Troponin I: 0.06 ng/mL (ref <0.03)

## 2016-09-16 MED ORDER — SODIUM CHLORIDE 0.9 % IV SOLN
1000.0000 mL | Freq: Once | INTRAVENOUS | Status: AC
  Administered 2016-09-16: 1000 mL via INTRAVENOUS

## 2016-09-16 NOTE — ED Notes (Signed)
Pt still unable to urinate after fluids provided

## 2016-09-16 NOTE — ED Triage Notes (Signed)
Patient to ED for weakness since last week when she had food poisoning. States she has not been getting any better since seeing her doctor last week despite drinking plenty of fluids. Denies black or tarry stools. Skin is warm/dry/pale

## 2016-09-16 NOTE — H&P (Signed)
History and Physical   [facility] - [place] @ [facility] Admission History and Physical [employer], D.O.    Patient Name: Caitlin Yates MR#: [id number] Date of Birth: 1940-10-11 Date of Admission: 09/16/2016  Referring MD/NP/PA: Dr. Jimmye Norman Primary Care Physician: Marjo Bicker, MD  Chief Complaint:  Chief Complaint  Patient presents with  . Weakness    HPI: Caitlin Yates is a 76 y.o. female with a known history of diabetes, hypertension, hyperlipidemia and GERD presents to the emergency department for evaluation of weakness.  Patient was in a usual state of health until several days ago when she began experiencing nausea vomiting and diarrhea which her PCP attributed to food poisoning.  She never had abdominal or chest pain.  Her symptoms improved but she comes to the ER tonight with persistent weakness.    Patient denies fevers/chills, dizziness, chest pain, shortness of breath, , abdominal pain, dysuria/frequency, changes in mental status.    Otherwise there has been no change in status. Patient has been taking medication as prescribed and there has been no recent change in medication or diet.  No recent antibiotics.  There has been no recent illness, hospitalizations, travel or sick contacts.    EMS/ED Course: Patient received normal saline. Medical admission has been requested for further management of acute kidney injury secondary to dehydration, elevated troponin, anemia.  Review of Systems:  CONSTITUTIONAL: Positive weakness.  No fever/chills, weight gain/loss, headache. EYES: No blurry or double vision. ENT: No tinnitus, postnasal drip, redness or soreness of the oropharynx. RESPIRATORY: No cough, dyspnea, wheeze.  No hemoptysis.  CARDIOVASCULAR: No chest pain, palpitations, syncope, orthopnea. No lower extremity edema.  GASTROINTESTINAL: Positive nausea and vomiting, diarrhea. No constipation.  No hematemesis, melena or hematochezia. GENITOURINARY: No dysuria,  frequency, hematuria. ENDOCRINE: No polyuria or nocturia. No heat or cold intolerance. HEMATOLOGY: No anemia, bruising, bleeding. INTEGUMENTARY: No rashes, ulcers, lesions. MUSCULOSKELETAL: No arthritis, gout, dyspnea. NEUROLOGIC: No numbness, tingling, ataxia, seizure-type activity. PSYCHIATRIC: No anxiety, depression, insomnia.   Past Medical History:  Diagnosis Date  . Diabetes mellitus without complication ([place])   . GERD (gastroesophageal reflux disease)   . Hypercholesterolemia   . Hypertension     Past Surgical History:  Procedure Laterality Date  . vascular stent right leg       reports that she has never smoked. She does not have any smokeless tobacco history on file. She reports that she does not drink alcohol or use drugs.  Allergies  Allergen Reactions  . Lisinopril Swelling    Tongue swelling    Family History  Problem Relation Age of Onset  . Colon cancer Neg Hx     Prior to Admission medications   Medication Sig Start Date End Date Taking? Authorizing Provider  aspirin EC 81 MG tablet Take 81 mg by mouth every morning.    [provider]  atenolol-chlorthalidone (TENORETIC) 50-25 MG per tablet Take 1 tablet by mouth every morning.    [provider]  atorvastatin (LIPITOR) 40 MG tablet Take 1 tablet (40 mg total) by mouth daily. 11/08/15   Arnoldo Lenis, MD  clopidogrel (PLAVIX) 75 MG tablet Take 75 mg by mouth every morning.    [provider]  DEXILANT 60 MG capsule Take 60 mg by mouth daily.  07/16/14   [provider]  glimepiride (AMARYL) 4 MG tablet Take 4 mg by mouth daily with breakfast.  02/04/15   [provider]  Multiple Vitamins-Minerals (CENTURY MATURE ADULT FORMULA PO) Take  1 tablet by mouth every morning.    [provider]  potassium chloride SA (K-DUR,KLOR-CON) 20 MEQ tablet Take 20 mEq by mouth daily.    [provider]  sitaGLIPtan-metformin (JANUMET) 50-1000 MG per tablet  Take 1 tablet by mouth 2 (two) times daily.    [provider]  Vitamin D, Ergocalciferol, (DRISDOL) 50000 units CAPS capsule Take 50,000 Units by mouth every Wednesday.    [provider]    Physical Exam: Vitals:   09/16/16 2057  Pulse: 73  Resp: 18  Temp: 98.6 F (37 C)  TempSrc: Oral  SpO2: 99%  Weight: 69.9 kg (154 lb)  Height: 5\' 2"  (1.575 m)    GENERAL: 76 y.o.-year-old female patient, well-developed, well-nourished lying in the bed in no acute distress.  Pleasant and cooperative.   HEENT: Head atraumatic, normocephalic. Pupils equal. Mucus membranes dry. NECK: Supple, full range of motion. No JVD, no bruit heard. No thyroid enlargement, no tenderness, no cervical lymphadenopathy. CHEST: Normal breath sounds bilaterally. No wheezing, rales, rhonchi or crackles. No use of accessory muscles of respiration.  No reproducible chest wall tenderness.  CARDIOVASCULAR: S1, S2 normal. No murmurs, rubs, or gallops. Cap refill <2 seconds. Pulses intact distally.  ABDOMEN: Soft, nondistended, nontender. No rebound, guarding, rigidity. Normoactive bowel sounds present in all four quadrants.  EXTREMITIES: No pedal edema, cyanosis, or clubbing. No calf tenderness or Homan's sign.  NEUROLOGIC: The patient is alert and oriented x 3. Cranial nerves II through XII are grossly intact with no focal sensorimotor deficit. PSYCHIATRIC:  Normal affect, mood, thought content. SKIN: Warm, dry, and intact without obvious rash, lesion, or ulcer.    Labs on Admission:  CBC:  Recent Labs Lab 09/16/16 2126  WBC 9.4  NEUTROABS 5.9  HGB 9.4*  HCT 26.3*  MCV 87.3  PLT 376*   Basic Metabolic Panel:  Recent Labs Lab 09/16/16 2126  NA 136  K 3.9  CL 97*  CO2 25  GLUCOSE 123*  BUN 43*  CREATININE 2.12*  CALCIUM 9.4   GFR: Estimated Creatinine Clearance: 20.3 mL/min (A) (by C-G formula based on SCr of 2.12 mg/dL (H)). Liver Function Tests:  Recent Labs Lab 09/16/16 2126   AST 25  ALT 22  ALKPHOS 51  BILITOT 0.6  PROT 7.7  ALBUMIN 2.9*   No results for input(s): LIPASE, AMYLASE in the last 168 hours. No results for input(s): AMMONIA in the last 168 hours. Coagulation Profile: No results for input(s): INR, PROTIME in the last 168 hours. Cardiac Enzymes:  Recent Labs Lab 09/16/16 2126  TROPONINI 0.06*   BNP (last 3 results) No results for input(s): PROBNP in the last 8760 hours. HbA1C: No results for input(s): HGBA1C in the last 72 hours. CBG: No results for input(s): GLUCAP in the last 168 hours. Lipid Profile: No results for input(s): CHOL, HDL, LDLCALC, TRIG, CHOLHDL, LDLDIRECT in the last 72 hours. Thyroid Function Tests: No results for input(s): TSH, T4TOTAL, FREET4, T3FREE, THYROIDAB in the last 72 hours. Anemia Panel: No results for input(s): VITAMINB12, FOLATE, FERRITIN, TIBC, IRON, RETICCTPCT in the last 72 hours. Urine analysis:    Component Value Date/Time   COLORURINE YELLOW 04/08/2015 2001   APPEARANCEUR HAZY (A) 04/08/2015 2001   LABSPEC 1.015 04/08/2015 2001   PHURINE 6.0 04/08/2015 2001   GLUCOSEU NEGATIVE 04/08/2015 2001   HGBUR SMALL (A) 04/08/2015 2001   BILIRUBINUR SMALL (A) 04/08/2015 2001   KETONESUR NEGATIVE 04/08/2015 Yates NEGATIVE 04/08/2015 2001   UROBILINOGEN  0.2 08/18/2012 1835   NITRITE NEGATIVE 04/08/2015 2001   LEUKOCYTESUR SMALL (A) 04/08/2015 2001   Sepsis Labs: @LABRCNTIP (procalcitonin:4,lacticidven:4) )No results found for this or any previous visit (from the past 240 hour(s)).   Radiological Exams on Admission: No results found.  EKG: Normal sinus rhythm at 70 bpm with normal axis, LVH and nonspecific ST-T wave changes.   Assessment/Plan  This is a 76 y.o. female with a history of diabetes, hypertension, hyperlipidemia and GERD now being admitted with:  #. Acute kidney injury 2/2 dehydration - Admit inpatient, med-surg with tele monitoring.  - IV fluids and repeat BMP in AM.   - Avoid nephrotoxic medications - Bladder scan and place foley catheter if evidence of urinary retention  #. Elevated troponin - Telemetry monitoring. - Trend troponins - Consider cardio workup if trending up.   #. UTI - IV Rocephin - Follow up cultures  #. Anemia - Serial CBC - Check FOBT, ferritin, folate B12  #. History of DM - Accu-Cheks before meals and at bedtime with RISS - Hold Janumet and glimepiride  #. History of GERD - Continue Protonix for Dexilant  #. History of HLD - Continue Lipitor  #. History of HTN - Continue atenolol/chlorthalidone  #. History of PVD - Continue aspirin and Plavix  Admission status: Inpatient IV Fluids: NS Diet/Nutrition: HH, CC Consults called: None  DVT Px: Lovenox, SCDs and early ambulation. Code Status: Full Code  Disposition Plan: To home in 1-2 days  All the records are reviewed and case discussed with ED provider. Management plans discussed with the patient and/or family who express understanding and agree with plan of care.  Alveda Vanhorne D.O. on 09/16/2016 at 11:52 PM Between 7am to 6pm - Pager - [phone] After 6pm go to [email or web address] - Proofreader [facility] Office [phone] CC: Primary care physician; Marjo Bicker, MD   09/16/2016, 11:52 PM

## 2016-09-16 NOTE — ED Notes (Signed)
Patient roomed to room 13 via wheelchair

## 2016-09-16 NOTE — ED Notes (Signed)
CRITICAL LAB: TROPONIN is 0.06, [facility]UL[facility] Lab, Dr. Jimmye Norman notified, orders received

## 2016-09-16 NOTE — ED Provider Notes (Signed)
[facility] Emergency Department Provider Note       Time seen: ----------------------------------------- 8:59 PM on 09/16/2016 -----------------------------------------     I have reviewed the triage vital signs and the nursing notes.   HISTORY   Chief Complaint Weakness    HPI Caitlin Yates is a 76 y.o. female who presents to the ED for weakness. Patient describes generalized weakness that has persisted over the past week. Patient reports at the beginning of last weekend she had nausea, vomiting and diarrhea. She states subsequently the symptoms have resolved but she does not feel any better. She denies any other focal complaints at this time.   Past Medical History:  Diagnosis Date  . Diabetes mellitus without complication ([place])   . GERD (gastroesophageal reflux disease)   . Hypercholesterolemia   . Hypertension     Patient Active Problem List   Diagnosis Date Noted  . Pancreatitis, acute 08/20/2012    Past Surgical History:  Procedure Laterality Date  . vascular stent right leg      Allergies Lisinopril  Social History Social History  Substance Use Topics  . Smoking status: Never Smoker  . Smokeless tobacco: Not on file  . Alcohol use No    Review of Systems Constitutional: Negative for fever. Eyes: Negative for vision changes ENT:  Negative for congestion, sore throat Cardiovascular: Negative for chest pain. Respiratory: Negative for shortness of breath. Gastrointestinal: Negative for abdominal pain, vomiting and diarrhea. Genitourinary: Negative for dysuria. Musculoskeletal: Negative for back pain. Skin: Negative for rash. Neurological:Positive for generalized weakness  All systems negative/normal/unremarkable except as stated in the HPI  ____________________________________________   PHYSICAL EXAM:  VITAL SIGNS: ED Triage Vitals  Enc Vitals Group     BP --      Pulse Rate 09/16/16 2057 73     Resp 09/16/16  2057 18     Temp 09/16/16 2057 98.6 F (37 C)     Temp Source 09/16/16 2057 Oral     SpO2 09/16/16 2057 99 %     Weight 09/16/16 2057 154 lb (69.9 kg)     Height 09/16/16 2057 5\' 2"  (1.575 m)     Head Circumference --      Peak Flow --      Pain Score 09/16/16 2056 0     Pain Loc --      Pain Edu? --      Excl. in [place]? --     Constitutional: Alert and oriented. Well appearing and in no distress. Eyes: Conjunctivae are normal. Normal extraocular movements. ENT   Head: Normocephalic and atraumatic.   Nose: No congestion/rhinnorhea.   Mouth/Throat: Mucous membranes are moist.   Neck: No stridor. Cardiovascular: Normal rate, regular rhythm. No murmurs, rubs, or gallops. Respiratory: Normal respiratory effort without tachypnea nor retractions. Breath sounds are clear and equal bilaterally. No wheezes/rales/rhonchi. Gastrointestinal: Soft and nontender. Normal bowel sounds. Heme negative stool, nontender Musculoskeletal: Nontender with normal range of motion in extremities. No lower extremity tenderness nor edema. Neurologic:  Normal speech and language. No gross focal neurologic deficits are appreciated.  Skin:  Skin is warm, dry and intact. No rash noted. Psychiatric: Mood and affect are normal. Speech and behavior are normal.  ____________________________________________  EKG: Interpreted by me. Sinus rhythm rate of 70 bpm, LVH, borderline QRS, normal PR interval, normal QT  ____________________________________________  ED COURSE:  Pertinent labs & imaging results that were available during my care of the patient were reviewed by me and considered  in my medical decision making (see chart for details). Patient presents for weakness, we will assess with labs and imaging as indicated.   Procedures ____________________________________________   LABS (pertinent positives/negatives)  Labs Reviewed  CBC WITH DIFFERENTIAL/PLATELET - Abnormal; Notable for the following:        Result Value   RBC 3.01 (*)    Hemoglobin 9.4 (*)    HCT 26.3 (*)    Platelets 515 (*)    All other components within normal limits  COMPREHENSIVE METABOLIC PANEL - Abnormal; Notable for the following:    Chloride 97 (*)    Glucose, Bld 123 (*)    BUN 43 (*)    Creatinine, Ser 2.12 (*)    Albumin 2.9 (*)    GFR calc non Af Amer 21 (*)    GFR calc Af Amer 25 (*)    All other components within normal limits  TROPONIN I - Abnormal; Notable for the following:    Troponin I 0.06 (*)    All other components within normal limits  URINALYSIS, COMPLETE (UACMP) WITH MICROSCOPIC   ___________________________________________  FINAL ASSESSMENT AND PLAN  Weakness, acute renal failure, anemia, elevated troponin  Plan: Patient's labs and imaging were dictated above. Patient had presented for weakness which is likely secondary to multiple factors. She was found to be in mild acute renal failure with a creatinine of 2.12. She is also anemic with a hemoglobin of 9.4. Her stool was heme-negative here so this is of uncertain etiology. Troponin slightly elevated which will need to be rechecked. We will give aspirin, continue IV fluids and discussed with the hospitalist for admission.   Earleen Newport, MD   Note: This note was generated in part or whole with voice recognition software. Voice recognition is usually quite accurate but there are transcription errors that can and very often do occur. I apologize for any typographical errors that were not detected and corrected.     Earleen Newport, MD 09/16/16 2237

## 2016-09-17 DIAGNOSIS — N179 Acute kidney failure, unspecified: Secondary | ICD-10-CM | POA: Diagnosis not present

## 2016-09-17 DIAGNOSIS — R531 Weakness: Secondary | ICD-10-CM | POA: Diagnosis not present

## 2016-09-17 LAB — COMPREHENSIVE METABOLIC PANEL
ALBUMIN: 2.6 g/dL — AB (ref 3.5–5.0)
ALT: 19 U/L (ref 14–54)
AST: 22 U/L (ref 15–41)
Alkaline Phosphatase: 44 U/L (ref 38–126)
Anion gap: 11 (ref 5–15)
BUN: 40 mg/dL — AB (ref 6–20)
CHLORIDE: 101 mmol/L (ref 101–111)
CO2: 27 mmol/L (ref 22–32)
Calcium: 9 mg/dL (ref 8.9–10.3)
Creatinine, Ser: 1.91 mg/dL — ABNORMAL HIGH (ref 0.44–1.00)
GFR calc Af Amer: 28 mL/min — ABNORMAL LOW (ref >60)
GFR calc non Af Amer: 24 mL/min — ABNORMAL LOW (ref >60)
GLUCOSE: 119 mg/dL — AB (ref 65–99)
POTASSIUM: 3.5 mmol/L (ref 3.5–5.1)
Sodium: 139 mmol/L (ref 135–145)
Total Bilirubin: 0.5 mg/dL (ref 0.3–1.2)
Total Protein: 7.2 g/dL (ref 6.5–8.1)

## 2016-09-17 LAB — GLUCOSE, CAPILLARY
GLUCOSE-CAPILLARY: 138 mg/dL — AB (ref 65–99)
GLUCOSE-CAPILLARY: 153 mg/dL — AB (ref 65–99)
Glucose-Capillary: 83 mg/dL (ref 65–99)
Glucose-Capillary: 142 mg/dL — ABNORMAL HIGH (ref 65–99)
Glucose-Capillary: 128 mg/dL — ABNORMAL HIGH (ref 65–99)

## 2016-09-17 LAB — URINALYSIS, COMPLETE (UACMP) WITH MICROSCOPIC
Bilirubin Urine: NEGATIVE
Glucose, UA: NEGATIVE mg/dL
Hgb urine dipstick: NEGATIVE
KETONES UR: NEGATIVE mg/dL
Nitrite: NEGATIVE
PROTEIN: 30 mg/dL — AB
Specific Gravity, Urine: 1.016 (ref 1.005–1.030)
pH: 5 (ref 5.0–8.0)

## 2016-09-17 LAB — TROPONIN I
Troponin I: 0.06 ng/mL (ref <0.03)
Troponin I: 0.05 ng/mL (ref <0.03)

## 2016-09-17 LAB — PHOSPHORUS: PHOSPHORUS: 3.6 mg/dL (ref 2.5–4.6)

## 2016-09-17 LAB — CBC
HCT: 28.4 % — ABNORMAL LOW (ref 35.0–47.0)
Hemoglobin: 9.8 g/dL — ABNORMAL LOW (ref 12.0–16.0)
MCH: 30.7 pg (ref 26.0–34.0)
MCHC: 34.6 g/dL (ref 32.0–36.0)
MCV: 88.8 fL (ref 80.0–100.0)
PLATELETS: 466 10*3/uL — AB (ref 150–440)
RBC: 3.19 MIL/uL — AB (ref 3.80–5.20)
RDW: 14.4 % (ref 11.5–14.5)
WBC: 8.2 10*3/uL (ref 3.6–11.0)

## 2016-09-17 LAB — TSH: TSH: 1.62 u[IU]/mL (ref 0.350–4.500)

## 2016-09-17 LAB — MAGNESIUM: Magnesium: 1.7 mg/dL (ref 1.7–2.4)

## 2016-09-17 LAB — VITAMIN B12: Vitamin B-12: 299 pg/mL (ref 180–914)

## 2016-09-17 LAB — FERRITIN: Ferritin: 117 ng/mL (ref 11–307)

## 2016-09-17 LAB — FOLATE: Folate: 21.6 ng/mL (ref >5.9)

## 2016-09-17 MED ORDER — CLOPIDOGREL BISULFATE 75 MG PO TABS
75.0000 mg | ORAL_TABLET | Freq: Every morning | ORAL | Status: DC
Start: 2016-09-17 — End: 2016-09-18
  Administered 2016-09-17 – 2016-09-18 (×2): 75 mg via ORAL
  Filled 2016-09-17 (×2): qty 1

## 2016-09-17 MED ORDER — DEXTROSE 5 % IV SOLN
2.0000 g | Freq: Once | INTRAVENOUS | Status: AC
  Administered 2016-09-17: 02:00:00 2 g via INTRAVENOUS
  Filled 2016-09-17: qty 2

## 2016-09-17 MED ORDER — ATORVASTATIN CALCIUM 20 MG PO TABS
40.0000 mg | ORAL_TABLET | Freq: Every day | ORAL | Status: DC
  Administered 2016-09-17 – 2016-09-18 (×2): 40 mg via ORAL
  Filled 2016-09-17 (×3): qty 2

## 2016-09-17 MED ORDER — DEXTROSE 5 % IV SOLN
2.0000 g | INTRAVENOUS | Status: DC
  Administered 2016-09-18: 09:00:00 2 g via INTRAVENOUS
  Filled 2016-09-17: qty 2

## 2016-09-17 MED ORDER — SODIUM CHLORIDE 0.9% FLUSH
3.0000 mL | Freq: Two times a day (BID) | INTRAVENOUS | Status: DC
  Administered 2016-09-17 – 2016-09-18 (×2): 3 mL via INTRAVENOUS

## 2016-09-17 MED ORDER — POTASSIUM CHLORIDE CRYS ER 20 MEQ PO TBCR
20.0000 meq | EXTENDED_RELEASE_TABLET | Freq: Every day | ORAL | Status: DC
  Administered 2016-09-17 – 2016-09-18 (×2): 20 meq via ORAL
  Filled 2016-09-17 (×2): qty 1

## 2016-09-17 MED ORDER — INSULIN ASPART 100 UNIT/ML ~~LOC~~ SOLN: 0.0000 [IU] | Freq: Every day | SUBCUTANEOUS | Status: DC

## 2016-09-17 MED ORDER — MAGNESIUM CITRATE PO SOLN
1.0000 | Freq: Once | ORAL | Status: DC | PRN
  Filled 2016-09-17: qty 296

## 2016-09-17 MED ORDER — ENOXAPARIN SODIUM 30 MG/0.3ML ~~LOC~~ SOLN
30.0000 mg | SUBCUTANEOUS | Status: DC
  Administered 2016-09-17: 30 mg via SUBCUTANEOUS
  Filled 2016-09-17: qty 0.3

## 2016-09-17 MED ORDER — OXYCODONE HCL 5 MG PO TABS: 5.0000 mg | ORAL_TABLET | ORAL | Status: DC | PRN

## 2016-09-17 MED ORDER — ONDANSETRON HCL 4 MG PO TABS: 4.0000 mg | ORAL_TABLET | Freq: Four times a day (QID) | ORAL | Status: DC | PRN

## 2016-09-17 MED ORDER — PANTOPRAZOLE SODIUM 40 MG PO TBEC
40.0000 mg | DELAYED_RELEASE_TABLET | Freq: Every day | ORAL | Status: DC
  Administered 2016-09-17 – 2016-09-18 (×2): 40 mg via ORAL
  Filled 2016-09-17 (×2): qty 1

## 2016-09-17 MED ORDER — SODIUM CHLORIDE 0.9 % IV SOLN
INTRAVENOUS | Status: DC
  Administered 2016-09-17 – 2016-09-18 (×3): via INTRAVENOUS

## 2016-09-17 MED ORDER — CHLORTHALIDONE 25 MG PO TABS
25.0000 mg | ORAL_TABLET | Freq: Every day | ORAL | Status: DC
  Administered 2016-09-17 – 2016-09-18 (×2): 25 mg via ORAL
  Filled 2016-09-17 (×3): qty 1

## 2016-09-17 MED ORDER — CHLORHEXIDINE GLUCONATE 0.12 % MT SOLN
15.0000 mL | Freq: Two times a day (BID) | OROMUCOSAL | Status: DC
  Administered 2016-09-17 – 2016-09-18 (×3): 15 mL via OROMUCOSAL
  Filled 2016-09-17 (×3): qty 15

## 2016-09-17 MED ORDER — INSULIN ASPART 100 UNIT/ML ~~LOC~~ SOLN
0.0000 [IU] | Freq: Three times a day (TID) | SUBCUTANEOUS | Status: DC
Start: 2016-09-17 — End: 2016-09-18
  Administered 2016-09-17: 3 [IU] via SUBCUTANEOUS
  Administered 2016-09-17 (×2): 2 [IU] via SUBCUTANEOUS
  Administered 2016-09-18 (×2): 3 [IU] via SUBCUTANEOUS
  Filled 2016-09-17 (×5): qty 1

## 2016-09-17 MED ORDER — IPRATROPIUM BROMIDE 0.02 % IN SOLN: 0.5000 mg | Freq: Four times a day (QID) | RESPIRATORY_TRACT | Status: DC | PRN

## 2016-09-17 MED ORDER — BISACODYL 5 MG PO TBEC: 5.0000 mg | DELAYED_RELEASE_TABLET | Freq: Every day | ORAL | Status: DC | PRN

## 2016-09-17 MED ORDER — ACETAMINOPHEN 325 MG PO TABS: 650.0000 mg | ORAL_TABLET | Freq: Four times a day (QID) | ORAL | Status: DC | PRN

## 2016-09-17 MED ORDER — ATENOLOL 50 MG PO TABS
50.0000 mg | ORAL_TABLET | Freq: Every day | ORAL | Status: DC
  Administered 2016-09-17 – 2016-09-18 (×2): 50 mg via ORAL
  Filled 2016-09-17 (×2): qty 1

## 2016-09-17 MED ORDER — ENOXAPARIN SODIUM 40 MG/0.4ML ~~LOC~~ SOLN: 40.0000 mg | SUBCUTANEOUS | Status: DC

## 2016-09-17 MED ORDER — ATENOLOL-CHLORTHALIDONE 50-25 MG PO TABS: 1.0000 | ORAL_TABLET | Freq: Every morning | ORAL | Status: DC

## 2016-09-17 MED ORDER — ASPIRIN EC 81 MG PO TBEC
81.0000 mg | DELAYED_RELEASE_TABLET | Freq: Every morning | ORAL | Status: DC
  Administered 2016-09-17 – 2016-09-18 (×2): 81 mg via ORAL
  Filled 2016-09-17 (×2): qty 1

## 2016-09-17 MED ORDER — SENNOSIDES-DOCUSATE SODIUM 8.6-50 MG PO TABS
1.0000 | ORAL_TABLET | Freq: Every evening | ORAL | Status: DC | PRN
Start: 2016-09-17 — End: 2016-09-18

## 2016-09-17 MED ORDER — ONDANSETRON HCL 4 MG/2ML IJ SOLN: 4.0000 mg | Freq: Four times a day (QID) | INTRAMUSCULAR | Status: DC | PRN

## 2016-09-17 MED ORDER — ALBUTEROL SULFATE (2.5 MG/3ML) 0.083% IN NEBU: 2.5000 mg | INHALATION_SOLUTION | Freq: Four times a day (QID) | RESPIRATORY_TRACT | Status: DC | PRN

## 2016-09-17 MED ORDER — ACETAMINOPHEN 650 MG RE SUPP
650.0000 mg | Freq: Four times a day (QID) | RECTAL | Status: DC | PRN
Start: 2016-09-17 — End: 2016-09-18

## 2016-09-17 NOTE — ED Notes (Signed)
Ena Dawley RN, informed of assigned bed

## 2016-09-17 NOTE — Progress Notes (Signed)
Pharmacy Antibiotic Note  KENZLEY KE is a 76 y.o. female admitted on 09/16/2016 with UTI.  Pharmacy has been consulted for ceftriaxone dosing.  Plan: Ceftriaxone 2 grams q 24 hours ordered.  Height: 5\' 2"  (157.5 cm) Weight: 154 lb (69.9 kg) IBW/kg (Calculated) : 50.1  Temp (24hrs), Avg:98.1 F (36.7 C), Min:97.5 F (36.4 C), Max:98.6 F (37 C)   Recent Labs Lab 09/16/16 2126  WBC 9.4  CREATININE 2.12*    Estimated Creatinine Clearance: 20.3 mL/min (A) (by C-G formula based on SCr of 2.12 mg/dL (H)).    Allergies  Allergen Reactions  . Lisinopril Swelling    Tongue swelling    Antimicrobials this admission: Ceftriaxone 9/3 >>   >>   Dose adjustments this admission:   Microbiology results: No micro      9/2 UA: LE(+) NO2(-) WBC TNTC  Thank you for allowing pharmacy to be a part of this patient's care.  Liane Tribbey S 09/17/2016 1:04 AM

## 2016-09-17 NOTE — Progress Notes (Signed)
[place] [facility] [place] NAME: Caitlin Yates    MR#:  [id number]  DATE OF BIRTH:  1940-12-16  SUBJECTIVE:  CHIEF COMPLAINT:   Chief Complaint  Patient presents with  . Weakness  Feeling much better, creatinine improving REVIEW OF SYSTEMS:  Review of Systems  Constitutional: Positive for malaise/fatigue. Negative for chills, fever and weight loss.  HENT: Negative for nosebleeds and sore throat.   Eyes: Negative for blurred vision.  Respiratory: Negative for cough, shortness of breath and wheezing.   Cardiovascular: Negative for chest pain, orthopnea, leg swelling and PND.  Gastrointestinal: Negative for abdominal pain, constipation, diarrhea, heartburn, nausea and vomiting.  Genitourinary: Negative for dysuria and urgency.  Musculoskeletal: Negative for back pain.  Skin: Negative for rash.  Neurological: Positive for weakness. Negative for dizziness, speech change, focal weakness and headaches.  Endo/Heme/Allergies: Does not bruise/bleed easily.  Psychiatric/Behavioral: Negative for depression.    DRUG ALLERGIES:   Allergies  Allergen Reactions  . Lisinopril Swelling    Tongue swelling   VITALS:  Blood pressure (!) 146/58, pulse 73, temperature 98.2 F (36.8 C), temperature source Oral, resp. rate 18, height 5\' 2"  (1.575 m), weight 73.4 kg (161 lb 14.4 oz), SpO2 97 %. PHYSICAL EXAMINATION:  Physical Exam  Constitutional: She is oriented to person, place, and time and well-developed, well-nourished, and in no distress.  HENT:  Head: Normocephalic and atraumatic.  Eyes: Pupils are equal, round, and reactive to light. Conjunctivae and EOM are normal.  Neck: Normal range of motion. Neck supple. No tracheal deviation present. No thyromegaly present.  Cardiovascular: Normal rate, regular rhythm and normal heart sounds.   Pulmonary/Chest: Effort normal and breath sounds normal. No respiratory distress. She has no wheezes. She  exhibits no tenderness.  Abdominal: Soft. Bowel sounds are normal. She exhibits no distension. There is no tenderness.  Musculoskeletal: Normal range of motion.  Neurological: She is alert and oriented to person, place, and time. No cranial nerve deficit.  Skin: Skin is warm and dry. No rash noted.  Psychiatric: Mood and affect normal.   LABORATORY PANEL:  Female CBC  Recent Labs Lab 09/17/16 0313  WBC 8.2  HGB 9.8*  HCT 28.4*  PLT 466*   ------------------------------------------------------------------------------------------------------------------ Chemistries   Recent Labs Lab 09/16/16 2126 09/17/16 0313  NA 136 139  K 3.9 3.5  CL 97* 101  CO2 25 27  GLUCOSE 123* 119*  BUN 43* 40*  CREATININE 2.12* 1.91*  CALCIUM 9.4 9.0  MG 1.7  --   AST 25 22  ALT 22 19  ALKPHOS 51 44  BILITOT 0.6 0.5   RADIOLOGY:  No results found. ASSESSMENT AND PLAN:  This is a 76 y.o. female with a history of diabetes, hypertension, hyperlipidemia and GERD admitted with:  #. Acute kidney injury 2/2 dehydration - continue IV fluids and repeat BMP in AM.  - Avoid nephrotoxic medications - Creat 2.12->1.91  #. Elevated troponin -Due to demand ischemia, no MI  #. UTI - IV Rocephin - Follow up cultures  #. Anemia of chronic disease -Monitor.  No evidence of bleeding  #. History of DM -Sliding scale insulin - Hold Janumet and glimepiride  #. History of GERD - Continue Protonix for Dexilant  #. History of HLD - Continue Lipitor  #. History of HTN - Continue atenolol/chlorthalidone  #. History of PVD - Continue aspirin and Plavix     All the records are reviewed  and case discussed with Care Management/Social Worker. Management plans discussed with the patient, nursing and they are in agreement.  CODE STATUS: Full Code  TOTAL TIME TAKING CARE OF THIS PATIENT: 35 minutes.   More than 50% of the time was spent in counseling/coordination of care:  YES  POSSIBLE D/C IN 1-2 DAYS, DEPENDING ON CLINICAL CONDITION.   Max Sane M.D on 09/17/2016 at 11:39 AM  Between 7am to 6pm - Pager - [phone]  After 6pm go to [email or web address] - Proofreader  [facility]  Office  [phone]  CC: Primary care physician; Marjo Bicker, MD  Note: This dictation was prepared with Dragon dictation along with smaller phrase technology. Any transcriptional errors that result from this process are unintentional.

## 2016-09-17 NOTE — Evaluation (Signed)
Physical Therapy Evaluation Patient Details Name: Caitlin Yates MRN: [id number] DOB: 1940/12/16 Today's Date: 09/17/2016   History of Present Illness  76 yo Female came to ED with nausea and vomitting; She reports feeling bad over past week and thought it was food poisoning. However symptoms did not get better. She was diagnosed with acute kidney injury/weakness; PMH significant for DM, HTN, HLD, GERD  Clinical Impression  76 yo Female reports doing well today; She denies any pain currently. Patient reports living alone prior to admittance and was independent in all self care ADLs. She was driving and active in the community. Currently patient is mod I for bed mobility and transfers. She ambulated 160 feet with supervision without AD demonstrating functional gait pattern. Her 10 feet gait speed is 2.22 feet/sec indicating low fall risk. Patient demonstrates good static sitting and standing balance. She also demonstrates functional strength in BUE/BLE. She reports functioning at baseline. No skilled needs identified. Patient is agreeable to no pursuing skilled PT intervention at this time.     Follow Up Recommendations No PT follow up    Equipment Recommendations  None recommended by PT    Recommendations for Other Services       Precautions / Restrictions Precautions Precautions: None Restrictions Weight Bearing Restrictions: No      Mobility  Bed Mobility Overal bed mobility: Modified Independent             General bed mobility comments: uses elevated head of bed and bed rails prn  Transfers Overall transfer level: Modified independent Equipment used: None             General transfer comment: sit<>stand from various surfaces (bed/toilet) mod I with good hand placement and safety awareness;   Ambulation/Gait Ambulation/Gait assistance: Supervision Ambulation Distance (Feet): 160 Feet Assistive device: None Gait Pattern/deviations: WFL(Within Functional  Limits);Step-through pattern Gait velocity: 2.2 feet/sec;    General Gait Details: demonstrates step through gait pattern with good foot clearance and good dynamic balance;   Stairs            Wheelchair Mobility    Modified Rankin (Stroke Patients Only)       Balance Overall balance assessment: Modified Independent (static sitting balance: good; static standing balance: good; )                                           Pertinent Vitals/Pain Pain Assessment: No/denies pain    Home Living Family/patient expects to be discharged to:: Private residence Living Arrangements: Alone Available Help at Discharge: Family;Available PRN/intermittently (daughters live nearby available daily as needed; ) Type of Home: House Home Access: Stairs to enter Entrance Stairs-Rails: Right;Left;Can reach both Entrance Stairs-Number of Steps: 2 Home Layout: One level Home Equipment: Cane - single point      Prior Function Level of Independence: Independent         Comments: did not use any AD; was driving; independent in all self care ADLs;      Hand Dominance        Extremity/Trunk Assessment   Upper Extremity Assessment Upper Extremity Assessment: Overall WFL for tasks assessed    Lower Extremity Assessment Lower Extremity Assessment: Overall WFL for tasks assessed (grossly 4/5)    Cervical / Trunk Assessment Cervical / Trunk Assessment: Normal  Communication   Communication: No difficulties  Cognition Arousal/Alertness: Awake/alert Behavior During Therapy: WFL for  tasks assessed/performed Overall Cognitive Status: Within Functional Limits for tasks assessed                                        General Comments General comments (skin integrity, edema, etc.): skin grossly intact;     Exercises     Assessment/Plan    PT Assessment Patent does not need any further PT services  PT Problem List         PT Treatment  Interventions      PT Goals (Current goals can be found in the Care Plan section)  Acute Rehab PT Goals Patient Stated Goal: "I want to go home" PT Goal Formulation: With patient Time For Goal Achievement: 09/17/16 Potential to Achieve Goals: Good    Frequency     Barriers to discharge        Co-evaluation               AM-PAC PT "6 Clicks" Daily Activity  Outcome Measure Difficulty turning over in bed (including adjusting bedclothes, sheets and blankets)?: None Difficulty moving from lying on back to sitting on the side of the bed? : None Difficulty sitting down on and standing up from a chair with arms (e.g., wheelchair, bedside commode, etc,.)?: A Little Help needed moving to and from a bed to chair (including a wheelchair)?: None Help needed walking in hospital room?: None Help needed climbing 3-5 steps with a railing? : A Little 6 Click Score: 22    End of Session Equipment Utilized During Treatment: Gait belt Activity Tolerance: Patient tolerated treatment well;No increased pain Patient left: in chair;with call bell/phone within reach;with chair alarm set Nurse Communication: Mobility status PT Visit Diagnosis: Muscle weakness (generalized) (M62.81)    Time: 8469-6295 PT Time Calculation (min) (ACUTE ONLY): 23 min   Charges:   PT Evaluation $PT Eval Low Complexity: 1 Low     PT G Codes:   PT G-Codes **NOT FOR INPATIENT CLASS** Functional Assessment Tool Used: AM-PAC 6 Clicks Basic Mobility;Clinical judgement Functional Limitation: Mobility: Walking and moving around Mobility: Walking and Moving Around Current Status (M8413): At least 20 percent but less than 40 percent impaired, limited or restricted Mobility: Walking and Moving Around Goal Status [phone]): At least 20 percent but less than 40 percent impaired, limited or restricted Mobility: Walking and Moving Around Discharge Status [phone]): At least 20 percent but less than 40 percent impaired, limited or  restricted      Tattianna Schnarr PT, DPT 09/17/2016, 10:18 AM

## 2016-09-17 NOTE — Progress Notes (Signed)
Lovenox changed to 30 mg daily for CrCl <30 and BMI <40. 

## 2016-09-17 NOTE — ED Notes (Signed)
Pt found in room att, c/o weakness, food poisoning with N/V/D last week - not getting better, hx HTN and DM

## 2016-09-18 DIAGNOSIS — R531 Weakness: Secondary | ICD-10-CM | POA: Diagnosis not present

## 2016-09-18 DIAGNOSIS — N179 Acute kidney failure, unspecified: Secondary | ICD-10-CM | POA: Diagnosis not present

## 2016-09-18 LAB — CBC
HEMATOCRIT: 28 % — AB (ref 35.0–47.0)
Hemoglobin: 9.8 g/dL — ABNORMAL LOW (ref 12.0–16.0)
MCH: 30.6 pg (ref 26.0–34.0)
MCHC: 35 g/dL (ref 32.0–36.0)
MCV: 87.5 fL (ref 80.0–100.0)
Platelets: 528 10*3/uL — ABNORMAL HIGH (ref 150–440)
RBC: 3.21 MIL/uL — AB (ref 3.80–5.20)
RDW: 14.3 % (ref 11.5–14.5)
WBC: 9.6 10*3/uL (ref 3.6–11.0)

## 2016-09-18 LAB — BASIC METABOLIC PANEL
ANION GAP: 10 (ref 5–15)
BUN: 24 mg/dL — ABNORMAL HIGH (ref 6–20)
CO2: 24 mmol/L (ref 22–32)
Calcium: 9.2 mg/dL (ref 8.9–10.3)
Chloride: 104 mmol/L (ref 101–111)
Creatinine, Ser: 1.09 mg/dL — ABNORMAL HIGH (ref 0.44–1.00)
GFR calc Af Amer: 55 mL/min — ABNORMAL LOW (ref >60)
GFR calc non Af Amer: 48 mL/min — ABNORMAL LOW (ref >60)
GLUCOSE: 161 mg/dL — AB (ref 65–99)
POTASSIUM: 3.4 mmol/L — AB (ref 3.5–5.1)
Sodium: 138 mmol/L (ref 135–145)

## 2016-09-18 LAB — GLUCOSE, CAPILLARY
GLUCOSE-CAPILLARY: 237 mg/dL — AB (ref 65–99)
Glucose-Capillary: 173 mg/dL — ABNORMAL HIGH (ref 65–99)

## 2016-09-18 MED ORDER — CEPHALEXIN 250 MG PO CAPS: 250.0000 mg | ORAL_CAPSULE | Freq: Two times a day (BID) | ORAL | 0 refills | Status: AC

## 2016-09-18 MED ORDER — ENOXAPARIN SODIUM 40 MG/0.4ML ~~LOC~~ SOLN: 40.0000 mg | SUBCUTANEOUS | Status: DC

## 2016-09-18 NOTE — Discharge Instructions (Signed)
Urinary Tract Infection, Adult A urinary tract infection (UTI) is an infection of any part of the urinary tract. The urinary tract includes the:  Kidneys.  Ureters.  Bladder.  Urethra.  These organs make, store, and get rid of pee (urine) in the body. Follow these instructions at home:  Take over-the-counter and prescription medicines only as told by your doctor.  If you were prescribed an antibiotic medicine, take it as told by your doctor. Do not stop taking the antibiotic even if you start to feel better.  Avoid the following drinks: ? Alcohol. ? Caffeine. ? Tea. ? Carbonated drinks.  Drink enough fluid to keep your pee clear or pale yellow.  Keep all follow-up visits as told by your doctor. This is important.  Make sure to: ? Empty your bladder often and completely. Do not to hold pee for long periods of time. ? Empty your bladder before and after sex. ? Wipe from front to back after a bowel movement if you are female. Use each tissue one time when you wipe. Contact a doctor if:  You have back pain.  You have a fever.  You feel sick to your stomach (nauseous).  You throw up (vomit).  Your symptoms do not get better after 3 days.  Your symptoms go away and then come back. Get help right away if:  You have very bad back pain.  You have very bad lower belly (abdominal) pain.  You are throwing up and cannot keep down any medicines or water. This information is not intended to replace advice given to you by your health care provider. Make sure you discuss any questions you have with your health care provider. Document Released: 06/20/2007 Document Revised: 06/09/2015 Document Reviewed: 11/22/2014 Elsevier Interactive Patient Education  2018 [employer].   Acute Kidney Injury, Adult Acute kidney injury is a sudden worsening of kidney function. The kidneys are organs that have several jobs. They filter the blood to remove waste products and extra fluid. They  also maintain a healthy balance of minerals and hormones in the body, which helps control blood pressure and keep bones strong. With this condition, your kidneys do not do their jobs as well as they should. This condition ranges from mild to severe. Over time it may develop into long-lasting (chronic) kidney disease. Early detection and treatment may prevent acute kidney injury from developing into a chronic condition. What are the causes? Common causes of this condition include:  A problem with blood flow to the kidneys. This may be caused by: ? Low blood pressure (hypotension) or shock. ? Blood loss. ? Heart and blood vessel (cardiovascular) disease. ? Severe burns. ? Liver disease.  Direct damage to the kidneys. This may be caused by: ? Certain medicines. ? A kidney infection. ? Poisoning. ? Being around or in contact with toxic substances. ? A surgical wound. ? A hard, direct hit to the kidney area.  A sudden blockage of urine flow. This may be caused by: ? Cancer. ? Kidney stones. ? An enlarged prostate in males.  What are the signs or symptoms? Symptoms of this condition may not be obvious until the condition becomes severe. Symptoms of this condition can include:  Tiredness (lethargy), or difficulty staying awake.  Nausea or vomiting.  Swelling (edema) of the face, legs, ankles, or feet.  Problems with urination, such as: ? Abdominal pain, or pain along the side of your stomach (flank). ? Decreased urine production. ? Decrease in the force of urine  flow.  Muscle twitches and cramps, especially in the legs.  Confusion or trouble concentrating.  Loss of appetite.  Fever.  How is this diagnosed? This condition may be diagnosed with tests, including:  Blood tests.  Urine tests.  Imaging tests.  A test in which a sample of tissue is removed from the kidneys to be examined under a microscope (kidney biopsy).  How is this treated? Treatment for this  condition depends on the cause and how severe the condition is. In mild cases, treatment may not be needed. The kidneys may heal on their own. In more severe cases, treatment will involve:  Treating the cause of the kidney injury. This may involve changing any medicines you are taking or adjusting your dosage.  Fluids. You may need specialized IV fluids to balance your body's needs.  Having a catheter placed to drain urine and prevent blockages.  Preventing problems from occurring. This may mean avoiding certain medicines or procedures that can cause further injury to the kidneys.  In some cases treatment may also require:  A procedure to remove toxic wastes from the body (dialysis or continuous renal replacement therapy - CRRT).  Surgery. This may be done to repair a torn kidney, or to remove the blockage from the urinary system.  Follow these instructions at home: Medicines  Take over-the-counter and prescription medicines only as told by your health care provider.  Do not take any new medicines without your health care provider's approval. Many medicines can worsen your kidney damage.  Do not take any vitamin and mineral supplements without your health care provider's approval. Many nutritional supplements can worsen your kidney damage. Lifestyle  If your health care provider prescribed changes to your diet, follow them. You may need to decrease the amount of protein you eat.  Achieve and maintain a healthy weight. If you need help with this, ask your health care provider.  Start or continue an exercise plan. Try to exercise at least 30 minutes a day, 5 days a week.  Do not use any tobacco products, such as cigarettes, chewing tobacco, and e-cigarettes. If you need help quitting, ask your health care provider. General instructions  Keep track of your blood pressure. Report changes in your blood pressure as told by your health care provider.  Stay up to date with immunizations.  Ask your health care provider which immunizations you need.  Keep all follow-up visits as told by your health care provider. This is important. Where to find more information:  American Association of Kidney Patients: BombTimer.gl  National Kidney Foundation: www.kidney.[place]: https://mathis.com/  Life Options Rehabilitation Program: ? www.lifeoptions.org ? www.kidneyschool.org Contact a health care provider if:  Your symptoms get worse.  You develop new symptoms. Get help right away if:  You develop symptoms of worsening kidney disease, which include: ? Headaches. ? Abnormally dark or light skin. ? Easy bruising. ? Frequent hiccups. ? Chest pain. ? Shortness of breath. ? End of menstruation in women. ? Seizures. ? Confusion or altered mental status. ? Abdominal or back pain. ? Itchiness.  You have a fever.  Your body is producing less urine.  You have pain or bleeding when you urinate. Summary  Acute kidney injury is a sudden worsening of kidney function.  Acute kidney injury can be caused by problems with blood flow to the kidneys, direct damage to the kidneys, and sudden blockage of urine flow.  Symptoms of this condition may not be obvious until it becomes severe.  Symptoms may include edema, lethargy, confusion, nausea or vomiting, and problems passing urine.  This condition can usually be diagnosed with blood tests, urine tests, and imaging tests. Sometimes a kidney biopsy is done to diagnose this condition.  Treatment for this condition often involves treating the underlying cause. It is treated with fluids, medicines, dialysis, diet changes, or surgery. This information is not intended to replace advice given to you by your health care provider. Make sure you discuss any questions you have with your health care provider. Document Released: 07/17/2010 Document Revised: 12/23/2015 Document Reviewed: 12/23/2015 Elsevier Interactive Patient  Education  2017 [employer].

## 2016-09-18 NOTE — Progress Notes (Signed)
Pharmacy Anticoagulation Note  76 y/o F on Lovenox 30 mg daily for DVT prophylaxis.   Filed Weights   09/16/16 2057 09/17/16 0059  Weight: 154 lb (69.9 kg) 161 lb 14.4 oz (73.4 kg)    Estimated Creatinine Clearance: 40.5 mL/min (A) (by C-G formula based on SCr of 1.09 mg/dL (H)).  Body mass index is 29.61 kg/m.  Will increase Lovenox dosing to 40 mg daily with ARF resolving.   Ulice Dash, PharmD Clinical Pharmacist

## 2016-09-18 NOTE — Discharge Summary (Signed)
[facility] NAME: Caitlin Yates    MR#:  [id number]  DATE OF BIRTH:  1940-09-06  DATE OF ADMISSION:  09/16/2016   ADMITTING PHYSICIAN: Harvie Bridge, DO  DATE OF DISCHARGE: 09/18/2016 12:59 PM  PRIMARY CARE PHYSICIAN: Barry Dienes, NP   ADMISSION DIAGNOSIS:  Weakness [R53.1] Renal insufficiency [N28.9] Elevated troponin I level [R74.8] Anemia, unspecified type [D64.9] DISCHARGE DIAGNOSIS:  Active Problems:   Acute kidney injury ([place])  SECONDARY DIAGNOSIS:   Past Medical History:  Diagnosis Date  . Diabetes mellitus without complication ([place])   . GERD (gastroesophageal reflux disease)   . Hypercholesterolemia   . Hypertension    HOSPITAL COURSE:  This is a 76 y.o.femalewith a history of diabetes, hypertension, hyperlipidemia and GERDadmitted with:  #. Acute kidney injury 2/2 dehydration - improved with hydration - Creat 2.12->1.91-> 1.09  #. Elevated troponin -Due to demand ischemia, no MI  #. UTI - per UA, urine c/s still pending on the day of D/C  #. Anemia of chronic disease -stable. No evidence of bleeding   DISCHARGE CONDITIONS:  stable CONSULTS OBTAINED:   DRUG ALLERGIES:   Allergies  Allergen Reactions  . Lisinopril Swelling    Tongue swelling   DISCHARGE MEDICATIONS:   Allergies as of 09/18/2016      Reactions   Lisinopril Swelling   Tongue swelling      Medication List    TAKE these medications   aspirin EC 81 MG tablet Take 81 mg by mouth every morning.   atenolol-chlorthalidone 50-25 MG tablet Commonly known as:  TENORETIC Take 1 tablet by mouth every morning.   atorvastatin 40 MG tablet Commonly known as:  LIPITOR Take 1 tablet (40 mg total) by mouth daily.   CENTURY MATURE ADULT FORMULA PO Take 1 tablet by mouth every morning.   cephALEXin 250 MG capsule Commonly known as:  KEFLEX Take 1 capsule (250 mg total) by mouth 2 (two) times daily.   clopidogrel 75 MG  tablet Commonly known as:  PLAVIX Take 75 mg by mouth every morning.   DEXILANT 60 MG capsule Generic drug:  dexlansoprazole Take 60 mg by mouth daily.   glimepiride 4 MG tablet Commonly known as:  AMARYL Take 4 mg by mouth daily with breakfast.   potassium chloride SA 20 MEQ tablet Commonly known as:  K-DUR,KLOR-CON Take 20 mEq by mouth daily.   sitaGLIPtin-metformin 50-1000 MG tablet Commonly known as:  JANUMET Take 1 tablet by mouth 2 (two) times daily.   Vitamin D (Ergocalciferol) 50000 units Caps capsule Commonly known as:  DRISDOL Take 50,000 Units by mouth every Wednesday.            Discharge Care Instructions        Start     Ordered   09/18/16 0000  cephALEXin (KEFLEX) 250 MG capsule  2 times daily     09/18/16 1046   09/18/16 0000  Increase activity slowly     09/18/16 1046   09/18/16 0000  Diet - low sodium heart healthy     09/18/16 1046       DISCHARGE INSTRUCTIONS:   DIET:  Regular diet DISCHARGE CONDITION:  Good ACTIVITY:  Activity as tolerated OXYGEN:  Home Oxygen: No.  Oxygen Delivery: room air DISCHARGE LOCATION:  home   If you experience worsening of your admission symptoms, develop shortness of breath, life threatening emergency, suicidal or homicidal thoughts you must seek medical attention immediately by calling 911  or calling your MD immediately  if symptoms less severe.  You Must read complete instructions/literature along with all the possible adverse reactions/side effects for all the Medicines you take and that have been prescribed to you. Take any new Medicines after you have completely understood and accpet all the possible adverse reactions/side effects.   Please note  You were cared for by a hospitalist during your hospital stay. If you have any questions about your discharge medications or the care you received while you were in the hospital after you are discharged, you can call the unit and asked to speak with the  hospitalist on call if the hospitalist that took care of you is not available. Once you are discharged, your primary care physician will handle any further medical issues. Please note that NO REFILLS for any discharge medications will be authorized once you are discharged, as it is imperative that you return to your primary care physician (or establish a relationship with a primary care physician if you do not have one) for your aftercare needs so that they can reassess your need for medications and monitor your lab values.    On the day of Discharge:  VITAL SIGNS:  Blood pressure 128/63, pulse 75, temperature 99.2 F (37.3 C), temperature source Oral, resp. rate 20, height 5\' 2"  (1.575 m), weight 73.4 kg (161 lb 14.4 oz), SpO2 100 %. PHYSICAL EXAMINATION:  GENERAL:  76 y.o.-year-old patient lying in the bed with no acute distress.  EYES: Pupils equal, round, reactive to light and accommodation. No scleral icterus. Extraocular muscles intact.  HEENT: Head atraumatic, normocephalic. Oropharynx and nasopharynx clear.  NECK:  Supple, no jugular venous distention. No thyroid enlargement, no tenderness.  LUNGS: Normal breath sounds bilaterally, no wheezing, rales,rhonchi or crepitation. No use of accessory muscles of respiration.  CARDIOVASCULAR: S1, S2 normal. No murmurs, rubs, or gallops.  ABDOMEN: Soft, non-tender, non-distended. Bowel sounds present. No organomegaly or mass.  EXTREMITIES: No pedal edema, cyanosis, or clubbing.  NEUROLOGIC: Cranial nerves II through XII are intact. Muscle strength 5/5 in all extremities. Sensation intact. Gait not checked.  PSYCHIATRIC: The patient is alert and oriented x 3.  SKIN: No obvious rash, lesion, or ulcer.  DATA REVIEW:   CBC  Recent Labs Lab 09/18/16 0356  WBC 9.6  HGB 9.8*  HCT 28.0*  PLT 528*    Chemistries   Recent Labs Lab 09/16/16 2126 09/17/16 0313 09/18/16 0356  NA 136 139 138  K 3.9 3.5 3.4*  CL 97* 101 104  CO2 25 27 24     GLUCOSE 123* 119* 161*  BUN 43* 40* 24*  CREATININE 2.12* 1.91* 1.09*  CALCIUM 9.4 9.0 9.2  MG 1.7  --   --   AST 25 22  --   ALT 22 19  --   ALKPHOS 51 44  --   BILITOT 0.6 0.5  --      Follow-up Information    Barry Dienes, NP. Go on 09/28/2016.   Specialty:  Nurse Practitioner Why:  @11 :00 AM Contact information: [address] [place] [address] [phone]           Management plans discussed with the patient, family and they are in agreement.  CODE STATUS: Prior   TOTAL TIME TAKING CARE OF THIS PATIENT: 45 minutes.    Max Sane M.D on 09/18/2016 at 6:27 PM  Between 7am to 6pm - Pager - [phone]  After 6pm go to [email or web address] - [facility]  [facility]  Office  [phone]  CC: Primary care physician; Barry Dienes, NP   Note: This dictation was prepared with Dragon dictation along with smaller phrase technology. Any transcriptional errors that result from this process are unintentional.

## 2016-09-18 NOTE — Progress Notes (Signed)
Reviewed discharge information with patient. Called ride and patient will be picked up after lunch. Will continue to monitor.

## 2016-09-19 LAB — URINE CULTURE

## 2016-09-21 ENCOUNTER — Ambulatory Visit (HOSPITAL_COMMUNITY)
Admission: RE | Admit: 2016-09-21 | Discharge: 2016-09-21 | Disposition: A | Discharge status: Home / Self Care | Payer: Medicare Other | Source: Ambulatory Visit | Attending: Nurse Practitioner | Admitting: Nurse Practitioner

## 2016-09-21 DIAGNOSIS — Z1231 Encounter for screening mammogram for malignant neoplasm of breast: Secondary | ICD-10-CM | POA: Diagnosis present

## 2016-11-01 ENCOUNTER — Ambulatory Visit (INDEPENDENT_AMBULATORY_CARE_PROVIDER_SITE_OTHER): Payer: Medicare Other | Admitting: Cardiology

## 2016-11-01 ENCOUNTER — Encounter: Payer: Self-pay | Admitting: Cardiology

## 2016-11-01 VITALS — BP 150/84 | HR 64 | Ht 62.0 in | Wt 172.0 lb

## 2016-11-01 DIAGNOSIS — I739 Peripheral vascular disease, unspecified: Secondary | ICD-10-CM | POA: Diagnosis not present

## 2016-11-01 DIAGNOSIS — I10 Essential (primary) hypertension: Secondary | ICD-10-CM | POA: Diagnosis not present

## 2016-11-01 DIAGNOSIS — E782 Mixed hyperlipidemia: Secondary | ICD-10-CM | POA: Diagnosis not present

## 2016-11-01 DIAGNOSIS — I2510 Atherosclerotic heart disease of native coronary artery without angina pectoris: Secondary | ICD-10-CM

## 2016-11-01 DIAGNOSIS — I6523 Occlusion and stenosis of bilateral carotid arteries: Secondary | ICD-10-CM | POA: Diagnosis not present

## 2016-11-01 MED ORDER — AMLODIPINE BESYLATE 5 MG PO TABS: 5.0000 mg | ORAL_TABLET | Freq: Every day | ORAL | 3 refills | Status: DC

## 2016-11-01 NOTE — Patient Instructions (Signed)
Medication Instructions:  START NORVASC 5 MG DAILY   Labwork: I WILL REQUEST LABS FROM PCP   Testing/Procedures: Your physician has requested that you regularly monitor and record your blood pressure readings at home. Please use the same machine at the same time of day to check your readings and record them to bring to your follow-up visit. PLEASE KEEP A 2 WEEK BLOOD PRESSURE LOG    Follow-Up: Your physician wants you to follow-up in: 1 YEAR .  You will receive a reminder letter in the mail two months in advance. If you don't receive a letter, please call our office to schedule the follow-up appointment.   Any Other Special Instructions Will Be Listed Below (If Applicable).     If you need a refill on your cardiac medications before your next appointment, please call your pharmacy.

## 2016-11-01 NOTE — Progress Notes (Signed)
Clinical Summary Caitlin Yates is a 76 y.o.female seen today for follow up of the following medical problems.   1. PAD - previously followed in [place] for her PAD - reports prior history of claudication. She states she had a cath around 2011 or 2012, and she believes a stent placed at [facility]. Since that time symptoms have resolved.  - from records available appears she did have cath [place] 10/2010 with Left SFA with flap and 50-60% disease with 30% mid disease and 75% disease just prox to SFA. Occluded PTA, peroneal 75%. Prox right SFA 75%, followed by multiple tandem lesions 50% and more distal 70-75%. Occluded PTA, ATA and peroneal 60-70%.  - she had left common iliac stenting and right atherectomy of SFA with stenting.   - no recent leg pains. No foot sores.  - 10/2015 normal ABIs    2. DM2 - followed by pcp  3. HTN - angioedema on ACE-I, she is now off - home bp's 150s/ 70s   4. Hyperlipidemia - compliant with statin 07/2015 TC 150 TG 122 HLD 42 LDL 84 - side effects on atorva 80, has tolerated 40mg  daily  5. Carotid stenosis - [place] 08/2014 with only mild stenosis  6. 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   Past Medical History:  Diagnosis Date  . Diabetes mellitus without complication ([place])   . GERD (gastroesophageal reflux disease)   . Hypercholesterolemia   . Hypertension      Allergies  Allergen Reactions  . Lisinopril Swelling    Tongue swelling     Current Outpatient Prescriptions  Medication Sig Dispense Refill  . aspirin EC 81 MG tablet Take 81 mg by mouth every morning.    Marland Kitchen atenolol-chlorthalidone (TENORETIC) 50-25 MG per tablet Take 1 tablet by mouth every morning.    Marland Kitchen atorvastatin (LIPITOR) 40 MG tablet Take 1 tablet (40 mg total) by mouth daily. 90 tablet 2  . clopidogrel  (PLAVIX) 75 MG tablet Take 75 mg by mouth every morning.    Marland Kitchen DEXILANT 60 MG capsule Take 60 mg by mouth daily.     Marland Kitchen glimepiride (AMARYL) 4 MG tablet Take 4 mg by mouth daily with breakfast.     . Multiple Vitamins-Minerals (CENTURY MATURE ADULT FORMULA PO) Take 1 tablet by mouth every morning.    . potassium chloride SA (K-DUR,KLOR-CON) 20 MEQ tablet Take 20 mEq by mouth daily.    . sitaGLIPtan-metformin (JANUMET) 50-1000 MG per tablet Take 1 tablet by mouth 2 (two) times daily.    . Vitamin D, Ergocalciferol, (DRISDOL) 50000 units CAPS capsule Take 50,000 Units by mouth every Wednesday.     No current facility-administered medications for this visit.      Past Surgical History:  Procedure Laterality Date  . vascular stent right leg       Allergies  Allergen Reactions  . Lisinopril Swelling    Tongue swelling      Family History  Problem Relation Age of Onset  . Colon cancer Neg Hx      Social History Ms. Wassel reports that she has never smoked. She has never used smokeless tobacco. Ms. Manon reports that she does not drink alcohol.   Review of Systems CONSTITUTIONAL: No weight loss, fever, chills, weakness or fatigue.  HEENT: Eyes: No visual loss, blurred vision,  double vision or yellow sclerae.No hearing loss, sneezing, congestion, runny nose or sore throat.  SKIN: No rash or itching.  CARDIOVASCULAR: per hpi RESPIRATORY: No shortness of breath, cough or sputum.  GASTROINTESTINAL: No anorexia, nausea, vomiting or diarrhea. No abdominal pain or blood.  GENITOURINARY: No burning on urination, no polyuria NEUROLOGICAL: No headache, dizziness, syncope, paralysis, ataxia, numbness or tingling in the extremities. No change in bowel or bladder control.  MUSCULOSKELETAL: No muscle, back pain, joint pain or stiffness.  LYMPHATICS: No enlarged nodes. No history of splenectomy.  PSYCHIATRIC: No history of depression or anxiety.  ENDOCRINOLOGIC: No reports of sweating, cold  or heat intolerance. No polyuria or polydipsia.  Marland Kitchen   Physical Examination Vitals:   11/01/16 1107  BP: (!) 150/84  Pulse: 64  SpO2: 95%   Vitals:   11/01/16 1107  Weight: 172 lb (78 kg)  Height: 5\' 2"  (1.575 m)    Gen: resting comfortably, no acute distress HEENT: no scleral icterus, pupils equal round and reactive, no palptable cervical adenopathy,  CV: RRR, no m/r/g, no jvd Resp: Clear to auscultation bilaterally GI: abdomen is soft, non-tender, non-distended, normal bowel sounds, no hepatosplenomegaly MSK: extremities are warm, no edema.  Skin: warm, no rash Neuro:  no focal deficits Psych: appropriate affect   Diagnostic Studies  07/2014 ABI FINDINGS: Right ABI: Non calculable due to vascular noncompressibility  Left ABI: 1.66, probably elevated secondary to vascular calcification limiting compressibility  Right Lower Extremity: There is at biphasic waveform to the popliteal level. Monophasic waveforms distally.  Left Lower Extremity: Biphasic waveform through the popliteal artery, monophasic distally.  Pulse volume recording is relatively symmetric in amplitude at the ankle and metatarsal level.  IMPRESSION: 1. Lower extremity arterial occlusive disease, with limited evaluation secondary to vascular noncompressibility. Should the patient fail conservative treatment, consider CTA runoff to better define the site and nature of arterial occlusive disease and delineate treatment options.  10/2010 Cath [place] LM patent, LAD patent, LCX 30% prox, OM1 40%, RCA 50-% proximal  08/2014 Carotid US IMPRESSION: 1. Mild (1-49%) stenosis proximal right internal carotid artery secondary to mild smooth heterogeneous atherosclerotic plaque. 2. Trace focal atherosclerotic plaque in the left internal carotid artery without evidence of stenosis. 3. Vertebral arteries are patent with normal antegrade flow. 4. Intermittent irregularity of the cardiac rhythm  noted incidentally. Does the patient have a history of intermittent atrial fibrillation?    Assessment and Plan  1. PAD -- no recent symptoms, normal ABIs last year - continue to monitor. .   2. HTN - bp is above goal. Start norvasc 5mg  daily, submit bp log in 2 weeks  3. Hyperlipidemia - continue statin   4. Carotid stenosis - mild disease by recent US - we will continue to monitor.   5. CAD - nonobsructive CAD by cath in [place] in 2012.  - no recent symptoms, we will continue current meds  F/u 1 year      Arnoldo Lenis, M.D.

## 2016-11-13 ENCOUNTER — Telehealth: Payer: Self-pay | Admitting: Cardiology

## 2016-11-13 NOTE — Telephone Encounter (Signed)
I will forward to Dr.Branch for review. 

## 2016-11-13 NOTE — Telephone Encounter (Signed)
11/02/16 -148/89  11/03/16- 142/65 11/04/16- 167/68 11/05/16- 143/75 11/06/16- 137/62 11/07/16- 157/84 11/08/16- 147/65 11/09/16- 122/61 11/10/16- 142/65 11/11/16- 140/73   10 day BP readings--please give pt a call @ [phone]

## 2016-11-14 MED ORDER — AMLODIPINE BESYLATE 10 MG PO TABS: 10.0000 mg | ORAL_TABLET | Freq: Every day | ORAL | 6 refills | Status: DC

## 2016-11-14 NOTE — Telephone Encounter (Signed)
Pt notified and voiced understanding 

## 2016-11-14 NOTE — Telephone Encounter (Signed)
Still too high, increase norvasc to 10mg  daily. Repeat bp log x 10 days.  Zandra Abts MD

## 2016-11-27 ENCOUNTER — Telehealth: Payer: Self-pay | Admitting: Cardiology

## 2016-11-27 DIAGNOSIS — I10 Essential (primary) hypertension: Secondary | ICD-10-CM

## 2016-11-27 MED ORDER — SPIRONOLACTONE 25 MG PO TABS: 12.5000 mg | ORAL_TABLET | Freq: Every day | ORAL | 3 refills | Status: DC

## 2016-11-27 NOTE — Telephone Encounter (Signed)
Patient calling to report BP readings for past 10 days. / tg

## 2016-11-27 NOTE — Telephone Encounter (Signed)
BP readings from November 15, 2016  11/01 BP 141/74 11/02 BP 132/71  11/03 BP 141/71 11/04 BP 134/68 11/05 BP 132/66  11/06 BP 116/55 11/07 BP 162/66 11/08 BP 124/62 11/09 BP 125/60 11/10 BP 140/63  Patient has been taking all doses of medications as prescribed with any noted side effects.Patient said she does not take her BP at the same time each day but does use the same cuff and same arm. Patient taught that for consistency, that she needed to check her BP at the same time each day, preferably an hour after she's had her BP medications. Patient also admitted to taking some of her BP's immediately after activity. Patient taught that she should wait at least 5-10 minutes after rest to take her BP. Patient verbalized understanding.

## 2016-11-27 NOTE — Telephone Encounter (Signed)
Arnoldo Lenis, MD  Bernita Raisin, RN        Please start aldactone 12.5mg  daily, BMET in 2 weeks. Have her stop her home potassium for now    I spoke with patient, I will mail lab slip,she understands to stop potassium and start aldactone, and continue norvasc

## 2016-11-27 NOTE — Telephone Encounter (Signed)
Will verify Norvasc dose with Dr.Branch

## 2016-11-27 NOTE — Telephone Encounter (Signed)
BP's  141/74, 132/71,141/71,134/68,132/66,116/55, 162/66 ( received news of death in family), 124/62.125/60,140/63

## 2016-11-27 NOTE — Telephone Encounter (Signed)
On average bp's remain mildly elevated, her goal would be on average < 130/80. Please increase norvasc to 10mg  daily  Zandra Abts MD

## 2016-12-05 ENCOUNTER — Other Ambulatory Visit (HOSPITAL_COMMUNITY)
Admission: RE | Admit: 2016-12-05 | Discharge: 2016-12-05 | Disposition: A | Discharge status: Home / Self Care | Payer: Medicare Other | Source: Ambulatory Visit | Attending: Cardiology | Admitting: Cardiology

## 2016-12-05 DIAGNOSIS — I10 Essential (primary) hypertension: Secondary | ICD-10-CM | POA: Diagnosis present

## 2016-12-05 LAB — BASIC METABOLIC PANEL
Anion gap: 11 (ref 5–15)
BUN: 26 mg/dL — AB (ref 6–20)
CO2: 24 mmol/L (ref 22–32)
CREATININE: 1.34 mg/dL — AB (ref 0.44–1.00)
Calcium: 9.8 mg/dL (ref 8.9–10.3)
Chloride: 102 mmol/L (ref 101–111)
GFR calc Af Amer: 43 mL/min — ABNORMAL LOW (ref >60)
GFR, EST NON AFRICAN AMERICAN: 37 mL/min — AB (ref >60)
GLUCOSE: 112 mg/dL — AB (ref 65–99)
Potassium: 3.8 mmol/L (ref 3.5–5.1)
SODIUM: 137 mmol/L (ref 135–145)

## 2016-12-28 ENCOUNTER — Encounter (HOSPITAL_COMMUNITY): Payer: Self-pay | Admitting: Emergency Medicine

## 2016-12-28 ENCOUNTER — Other Ambulatory Visit: Payer: Self-pay

## 2016-12-28 ENCOUNTER — Emergency Department (HOSPITAL_COMMUNITY): Payer: Medicare Other

## 2016-12-28 ENCOUNTER — Emergency Department (HOSPITAL_COMMUNITY)
Admission: EM | Admit: 2016-12-28 | Discharge: 2016-12-28 | Disposition: A | Discharge status: Home / Self Care | Payer: Medicare Other | Attending: Emergency Medicine | Admitting: Emergency Medicine

## 2016-12-28 DIAGNOSIS — Z79899 Other long term (current) drug therapy: Secondary | ICD-10-CM | POA: Diagnosis not present

## 2016-12-28 DIAGNOSIS — I10 Essential (primary) hypertension: Secondary | ICD-10-CM | POA: Diagnosis not present

## 2016-12-28 DIAGNOSIS — M1000 Idiopathic gout, unspecified site: Secondary | ICD-10-CM | POA: Diagnosis not present

## 2016-12-28 DIAGNOSIS — E119 Type 2 diabetes mellitus without complications: Secondary | ICD-10-CM | POA: Insufficient documentation

## 2016-12-28 DIAGNOSIS — Z7984 Long term (current) use of oral hypoglycemic drugs: Secondary | ICD-10-CM | POA: Insufficient documentation

## 2016-12-28 DIAGNOSIS — M79671 Pain in right foot: Secondary | ICD-10-CM | POA: Diagnosis present

## 2016-12-28 LAB — COMPREHENSIVE METABOLIC PANEL
ALK PHOS: 65 U/L (ref 38–126)
ALT: 18 U/L (ref 14–54)
ANION GAP: 14 (ref 5–15)
AST: 25 U/L (ref 15–41)
Albumin: 3.1 g/dL — ABNORMAL LOW (ref 3.5–5.0)
BILIRUBIN TOTAL: 0.4 mg/dL (ref 0.3–1.2)
BUN: 29 mg/dL — AB (ref 6–20)
CALCIUM: 9.4 mg/dL (ref 8.9–10.3)
CO2: 26 mmol/L (ref 22–32)
Chloride: 94 mmol/L — ABNORMAL LOW (ref 101–111)
Creatinine, Ser: 1.46 mg/dL — ABNORMAL HIGH (ref 0.44–1.00)
GFR calc Af Amer: 39 mL/min — ABNORMAL LOW (ref >60)
GFR, EST NON AFRICAN AMERICAN: 33 mL/min — AB (ref >60)
Glucose, Bld: 174 mg/dL — ABNORMAL HIGH (ref 65–99)
POTASSIUM: 3 mmol/L — AB (ref 3.5–5.1)
Sodium: 134 mmol/L — ABNORMAL LOW (ref 135–145)
TOTAL PROTEIN: 7.8 g/dL (ref 6.5–8.1)

## 2016-12-28 LAB — CBC WITH DIFFERENTIAL/PLATELET
Basophils Absolute: 0 10*3/uL (ref 0.0–0.1)
Basophils Relative: 0 %
Eosinophils Absolute: 0 10*3/uL (ref 0.0–0.7)
Eosinophils Relative: 0 %
HEMATOCRIT: 33 % — AB (ref 36.0–46.0)
Hemoglobin: 10.3 g/dL — ABNORMAL LOW (ref 12.0–15.0)
LYMPHS ABS: 1.5 10*3/uL (ref 0.7–4.0)
LYMPHS PCT: 16 %
MCH: 27.5 pg (ref 26.0–34.0)
MCHC: 31.2 g/dL (ref 30.0–36.0)
MCV: 88 fL (ref 78.0–100.0)
MONO ABS: 1.1 10*3/uL — AB (ref 0.1–1.0)
MONOS PCT: 12 %
NEUTROS ABS: 6.9 10*3/uL (ref 1.7–7.7)
Neutrophils Relative %: 72 %
Platelets: 487 10*3/uL — ABNORMAL HIGH (ref 150–400)
RBC: 3.75 MIL/uL — ABNORMAL LOW (ref 3.87–5.11)
RDW: 14.4 % (ref 11.5–15.5)
WBC: 9.6 10*3/uL (ref 4.0–10.5)

## 2016-12-28 LAB — URIC ACID: URIC ACID, SERUM: 9.4 mg/dL — AB (ref 2.3–6.6)

## 2016-12-28 MED ORDER — POTASSIUM CHLORIDE ER 10 MEQ PO TBCR: 10.0000 meq | EXTENDED_RELEASE_TABLET | Freq: Every day | ORAL | 0 refills | Status: AC

## 2016-12-28 MED ORDER — TRAMADOL HCL 50 MG PO TABS
50.0000 mg | ORAL_TABLET | Freq: Once | ORAL | Status: AC
  Administered 2016-12-28: 50 mg via ORAL
  Filled 2016-12-28: qty 1

## 2016-12-28 MED ORDER — TRAMADOL HCL 50 MG PO TABS: 50.0000 mg | ORAL_TABLET | Freq: Four times a day (QID) | ORAL | 0 refills | Status: DC | PRN

## 2016-12-28 MED ORDER — POTASSIUM CHLORIDE CRYS ER 20 MEQ PO TBCR
40.0000 meq | EXTENDED_RELEASE_TABLET | Freq: Once | ORAL | Status: AC
  Administered 2016-12-28: 40 meq via ORAL
  Filled 2016-12-28: qty 2

## 2016-12-28 MED ORDER — ONDANSETRON 4 MG PO TBDP
4.0000 mg | ORAL_TABLET | Freq: Once | ORAL | Status: AC
  Administered 2016-12-28: 4 mg via ORAL
  Filled 2016-12-28: qty 1

## 2016-12-28 MED ORDER — PREDNISONE 10 MG PO TABS: 20.0000 mg | ORAL_TABLET | Freq: Every day | ORAL | 0 refills | Status: DC

## 2016-12-28 MED ORDER — ONDANSETRON 4 MG PO TBDP: ORAL_TABLET | ORAL | 0 refills | Status: DC

## 2016-12-28 NOTE — ED Triage Notes (Addendum)
PT c/o right foot pain with no injury. PT states she was seen at urgent care and had xrays done and had prednisone prescribed for dx of gout and was walking better but right foot has began to hurt again for the past week.

## 2016-12-28 NOTE — ED Provider Notes (Signed)
[facility] EMERGENCY DEPARTMENT Provider Note   CSN: [id number] Arrival date & time: 12/28/16  1320     History   Chief Complaint Chief Complaint  Patient presents with  . Foot Pain    HPI Caitlin Yates is a 76 y.o. female.  Patient complains of pain in her right foot.  She has been diagnosed with gout before this foot and was treated with prednisone and that helped   The history is provided by the patient.  Foot Pain  This is a recurrent problem. The current episode started more than 2 days ago. The problem occurs constantly. The problem has not changed since onset.Pertinent negatives include no chest pain, no abdominal pain and no headaches. Nothing aggravates the symptoms. Nothing relieves the symptoms. She has tried nothing for the symptoms. The treatment provided no relief.    Past Medical History:  Diagnosis Date  . Diabetes mellitus without complication ([place])   . GERD (gastroesophageal reflux disease)   . Hypercholesterolemia   . Hypertension     Patient Active Problem List   Diagnosis Date Noted  . Acute kidney injury ([place]) 09/17/2016  . Pancreatitis, acute 08/20/2012    Past Surgical History:  Procedure Laterality Date  . vascular stent right leg      OB History    No data available       Home Medications    Prior to Admission medications   Medication Sig Start Date End Date Taking? Authorizing Provider  acetaminophen (TYLENOL) 500 MG tablet Take 500 mg by mouth every 6 (six) hours as needed for mild pain or moderate pain.   Yes [provider]  amLODipine (NORVASC) 10 MG tablet Take 1 tablet (10 mg total) by mouth daily. 11/14/16 02/12/17 Yes BranchAlphonse Guild, MD  aspirin EC 81 MG tablet Take 81 mg by mouth every morning.   Yes [provider]  atenolol-chlorthalidone (TENORETIC) 50-25 MG per tablet Take 1 tablet by mouth every morning.   Yes [provider]  atorvastatin (LIPITOR) 40 MG tablet Take 1 tablet (40 mg  total) by mouth daily. Patient taking differently: Take 40 mg by mouth every evening.  11/08/15  Yes BranchAlphonse Guild, MD  clopidogrel (PLAVIX) 75 MG tablet Take 75 mg by mouth every morning.   Yes [provider]  DEXILANT 60 MG capsule Take 60 mg by mouth daily.  07/16/14  Yes [provider]  glimepiride (AMARYL) 4 MG tablet Take 4 mg by mouth daily with breakfast.  02/04/15  Yes [provider]  Multiple Vitamins-Minerals (CENTURY MATURE ADULT FORMULA PO) Take 1 tablet by mouth every morning.   Yes [provider]  naproxen (NAPROSYN) 500 MG tablet Take 500 mg by mouth every 12 (twelve) hours as needed.  09/11/16  Yes [provider]  sitaGLIPtan-metformin (JANUMET) 50-1000 MG per tablet Take 1 tablet by mouth 2 (two) times daily.   Yes [provider]  spironolactone (ALDACTONE) 25 MG tablet Take 0.5 tablets (12.5 mg total) daily by mouth. 11/27/16  Yes Branch, Alphonse Guild, MD  Vitamin D, Ergocalciferol, (DRISDOL) 50000 units CAPS capsule Take 50,000 Units by mouth every Wednesday.   Yes [provider]  ondansetron (ZOFRAN ODT) 4 MG disintegrating tablet 4mg  ODT q4 hours prn nausea/vomit 12/28/16   Milton Ferguson, MD  predniSONE (DELTASONE) 10 MG tablet Take 2 tablets (20 mg total) by mouth daily. 12/28/16   Milton Ferguson, MD  traMADol (ULTRAM) 50 MG tablet Take 1 tablet (50 mg  total) by mouth every 6 (six) hours as needed. 12/28/16   Milton Ferguson, MD    Family History Family History  Problem Relation Age of Onset  . CVA Mother   . Heart attack Brother   . Colon cancer Neg Hx     Social History Social History   Tobacco Use  . Smoking status: Never Smoker  . Smokeless tobacco: Never Used  Substance Use Topics  . Alcohol use: No  . Drug use: No     Allergies   Lisinopril and Statins   Review of Systems Review of Systems  Constitutional: Negative for appetite change and fatigue.  HENT: Negative for congestion,  ear discharge and sinus pressure.   Eyes: Negative for discharge.  Respiratory: Negative for cough.   Cardiovascular: Negative for chest pain.  Gastrointestinal: Negative for abdominal pain and diarrhea.  Genitourinary: Negative for frequency and hematuria.  Musculoskeletal: Negative for back pain.       Foot pain  Skin: Negative for rash.  Neurological: Negative for seizures and headaches.  Psychiatric/Behavioral: Negative for hallucinations.     Physical Exam Updated Vital Signs BP 133/73 (BP Location: Right Arm)   Pulse 86   Temp 99.3 F (37.4 C) (Oral)   Resp 16   Ht 5\' 2"  (1.575 m)   Wt 78 kg (172 lb)   SpO2 100%   BMI 31.46 kg/m   Physical Exam  Constitutional: She is oriented to person, place, and time. She appears well-developed.  HENT:  Head: Normocephalic.  Eyes: Conjunctivae are normal.  Neck: No tracheal deviation present.  Cardiovascular:  No murmur heard. Musculoskeletal: Normal range of motion.  Tender medial right foot neurovascular exam normal  Neurological: She is oriented to person, place, and time.  Skin: Skin is warm.  Psychiatric: She has a normal mood and affect.     ED Treatments / Results  Labs (all labs ordered are listed, but only abnormal results are displayed) Labs Reviewed  CBC WITH DIFFERENTIAL/PLATELET - Abnormal; Notable for the following components:      Result Value   RBC 3.75 (*)    Hemoglobin 10.3 (*)    HCT 33.0 (*)    Platelets 487 (*)    Monocytes Absolute 1.1 (*)    All other components within normal limits  COMPREHENSIVE METABOLIC PANEL - Abnormal; Notable for the following components:   Sodium 134 (*)    Potassium 3.0 (*)    Chloride 94 (*)    Glucose, Bld 174 (*)    BUN 29 (*)    Creatinine, Ser 1.46 (*)    Albumin 3.1 (*)    GFR calc non Af Amer 33 (*)    GFR calc Af Amer 39 (*)    All other components within normal limits  URIC ACID - Abnormal; Notable for the following components:   Uric Acid, Serum 9.4  (*)    All other components within normal limits    EKG  EKG Interpretation None       Radiology Dg Foot Complete Right  Result Date: 12/28/2016 CLINICAL DATA:  History of gout. EXAM: RIGHT FOOT COMPLETE - 3+ VIEW COMPARISON:  07/27/2016 FINDINGS: The bones appear osteopenic. No focal bone erosions identified. No fractures or dislocations. Small plantar heel spur. IMPRESSION: 1. No acute findings identified. 2. No bone erosions of crystalline deposition disease identified. Electronically Signed   By: Kerby Moors M.D.   On: 12/28/2016 15:54    Procedures Procedures (including critical care time)  Medications  Ordered in ED Medications  potassium chloride SA (K-DUR,KLOR-CON) CR tablet 40 mEq (not administered)  traMADol (ULTRAM) tablet 50 mg (not administered)  ondansetron (ZOFRAN-ODT) disintegrating tablet 4 mg (not administered)     Initial Impression / Assessment and Plan / ED Course  I have reviewed the triage vital signs and the nursing notes.  Pertinent labs & imaging results that were available during my care of the patient were reviewed by me and considered in my medical decision making (see chart for details).     Patient with a history of gout and elevated uric acid.  She will be treated with Ultram prednisone and Zofran and will follow up with her PCP  Final Clinical Impressions(s) / ED Diagnoses   Final diagnoses:  Idiopathic gout, unspecified chronicity, unspecified site    ED Discharge Orders        Ordered    traMADol (ULTRAM) 50 MG tablet  Every 6 hours PRN     12/28/16 1801    predniSONE (DELTASONE) 10 MG tablet  Daily     12/28/16 1801    ondansetron (ZOFRAN ODT) 4 MG disintegrating tablet     12/28/16 1801       Milton Ferguson, MD 12/28/16 1804

## 2016-12-28 NOTE — Discharge Instructions (Signed)
Follow up with your md °

## 2017-05-06 ENCOUNTER — Other Ambulatory Visit: Payer: Self-pay | Admitting: Cardiology

## 2017-05-07 ENCOUNTER — Other Ambulatory Visit (HOSPITAL_COMMUNITY): Payer: Self-pay | Admitting: Nurse Practitioner

## 2017-05-07 DIAGNOSIS — Z1231 Encounter for screening mammogram for malignant neoplasm of breast: Secondary | ICD-10-CM

## 2017-06-21 ENCOUNTER — Emergency Department
Admission: EM | Admit: 2017-06-21 | Discharge: 2017-06-21 | Disposition: A | Discharge status: Home / Self Care | Payer: Medicare Other | Attending: Emergency Medicine | Admitting: Emergency Medicine

## 2017-06-21 ENCOUNTER — Emergency Department: Payer: Medicare Other

## 2017-06-21 ENCOUNTER — Other Ambulatory Visit: Payer: Self-pay

## 2017-06-21 DIAGNOSIS — E119 Type 2 diabetes mellitus without complications: Secondary | ICD-10-CM | POA: Insufficient documentation

## 2017-06-21 DIAGNOSIS — Z79899 Other long term (current) drug therapy: Secondary | ICD-10-CM | POA: Insufficient documentation

## 2017-06-21 DIAGNOSIS — Z7982 Long term (current) use of aspirin: Secondary | ICD-10-CM | POA: Insufficient documentation

## 2017-06-21 DIAGNOSIS — I10 Essential (primary) hypertension: Secondary | ICD-10-CM | POA: Diagnosis not present

## 2017-06-21 DIAGNOSIS — M25551 Pain in right hip: Secondary | ICD-10-CM | POA: Insufficient documentation

## 2017-06-21 DIAGNOSIS — Z7902 Long term (current) use of antithrombotics/antiplatelets: Secondary | ICD-10-CM | POA: Diagnosis not present

## 2017-06-21 MED ORDER — OXYCODONE-ACETAMINOPHEN 5-325 MG PO TABS
1.0000 | ORAL_TABLET | Freq: Once | ORAL | Status: AC
Start: 2017-06-21 — End: 2017-06-21
  Administered 2017-06-21: 1 via ORAL
  Filled 2017-06-21: qty 1

## 2017-06-21 MED ORDER — TRAMADOL HCL 50 MG PO TABS: 50.0000 mg | ORAL_TABLET | Freq: Once | ORAL | Status: DC

## 2017-06-21 NOTE — ED Notes (Signed)
First Nurse Note: Pt to ED c/o right hip pain. Pt is in NAD at this time. Pt denies falling.

## 2017-06-21 NOTE — ED Provider Notes (Addendum)
[facility] Emergency Department Provider Note   ____________________________________________   First MD Initiated Contact with Patient 06/21/17 1443     (approximate)  I have reviewed the triage vital signs and the nursing notes.   HISTORY  Chief Complaint Hip Pain    HPI Caitlin Yates is a 77 y.o. female patient complaining of right hip pain that radiates down to mid thigh.  Patient states no provocative incident.  Patient the pain has increased in the past month.  Patient states she is scheduled to see orthopedics next week.  Patient states she got mild transient relief taking steroids which she finished last week.  Patient state after the steroids wore off her pain returned.  Patient rates the pain as a 8/10.  Patient described the pain is "achy".   Past Medical History:  Diagnosis Date  . Diabetes mellitus without complication ([place])   . GERD (gastroesophageal reflux disease)   . Hypercholesterolemia   . Hypertension     Patient Active Problem List   Diagnosis Date Noted  . Acute kidney injury ([place]) 09/17/2016  . Pancreatitis, acute 08/20/2012    Past Surgical History:  Procedure Laterality Date  . vascular stent right leg      Prior to Admission medications   Medication Sig Start Date End Date Taking? Authorizing Provider  acetaminophen (TYLENOL) 500 MG tablet Take 500 mg by mouth every 6 (six) hours as needed for mild pain or moderate pain.    [provider]  amLODipine (NORVASC) 10 MG tablet TAKE 1 TABLET BY MOUTH ONCE DAILY. 05/06/17   Arnoldo Lenis, MD  aspirin EC 81 MG tablet Take 81 mg by mouth every morning.    [provider]  atenolol-chlorthalidone (TENORETIC) 50-25 MG per tablet Take 1 tablet by mouth every morning.    [provider]  atorvastatin (LIPITOR) 40 MG tablet Take 1 tablet (40 mg total) by mouth daily. Patient taking differently: Take 40 mg by mouth every evening.  11/08/15   Arnoldo Lenis, MD  clopidogrel (PLAVIX) 75 MG tablet Take 75 mg by mouth every morning.    [provider]  DEXILANT 60 MG capsule Take 60 mg by mouth daily.  07/16/14   [provider]  glimepiride (AMARYL) 4 MG tablet Take 4 mg by mouth daily with breakfast.  02/04/15   [provider]  Multiple Vitamins-Minerals (CENTURY MATURE ADULT FORMULA PO) Take 1 tablet by mouth every morning.    [provider]  naproxen (NAPROSYN) 500 MG tablet Take 500 mg by mouth every 12 (twelve) hours as needed.  09/11/16   [provider]  ondansetron (ZOFRAN ODT) 4 MG disintegrating tablet 4mg  ODT q4 hours prn nausea/vomit 12/28/16   Milton Ferguson, MD  potassium chloride (K-DUR) 10 MEQ tablet Take 1 tablet (10 mEq total) by mouth daily. 12/28/16   Milton Ferguson, MD  predniSONE (DELTASONE) 10 MG tablet Take 2 tablets (20 mg total) by mouth daily. 12/28/16   Milton Ferguson, MD  sitaGLIPtan-metformin (JANUMET) 50-1000 MG per tablet Take 1 tablet by mouth 2 (two) times daily.    [provider]  spironolactone (ALDACTONE) 25 MG tablet Take 0.5 tablets (12.5 mg total) daily by mouth. 11/27/16   Branch, Alphonse Guild, MD  traMADol (ULTRAM) 50 MG tablet Take 1 tablet (50 mg total) by mouth every 6 (six) hours as needed. 12/28/16   Milton Ferguson, MD  Vitamin D, Ergocalciferol, (DRISDOL) 50000 units CAPS capsule Take 50,000 Units  by mouth every Wednesday.    [provider]    Allergies Lisinopril and Statins  Family History  Problem Relation Age of Onset  . CVA Mother   . Heart attack Brother   . Colon cancer Neg Hx     Social History Social History   Tobacco Use  . Smoking status: Never Smoker  . Smokeless tobacco: Never Used  Substance Use Topics  . Alcohol use: No  . Drug use: No    Review of Systems Constitutional: No fever/chills Eyes: No visual changes. ENT: No sore throat. Cardiovascular: Denies chest pain. Respiratory: Denies shortness  of breath. Gastrointestinal: No abdominal pain.  No nausea, no vomiting.  No diarrhea.  No constipation. Genitourinary: Negative for dysuria. Musculoskeletal: Right hip pain. Skin: Negative for rash. Neurological: Negative for headaches, focal weakness or numbness. Endocrine:Diabetes, hyperlipidemia, and hypertension. Allergic/Immunilogical: Lisinopril and statins. ____________________________________________   PHYSICAL EXAM:  VITAL SIGNS: ED Triage Vitals [06/21/17 1439]  Enc Vitals Group     BP 122/66     Pulse Rate 79     Resp 18     Temp 97.8 F (36.6 C)     Temp Source Oral     SpO2 98 %     Weight 159 lb (72.1 kg)     Height 5\' 2"  (1.575 m)     Head Circumference      Peak Flow      Pain Score 8     Pain Loc      Pain Edu?      Excl. in [place]?    Constitutional: Alert and oriented. Well appearing and in no acute distress. Cardiovascular: Normal rate, regular rhythm. Grossly normal heart sounds.  Good peripheral circulation. Respiratory: Normal respiratory effort.  No retractions. Lungs CTAB. Gastrointestinal: Soft and nontender. No distention. No abdominal bruits. No CVA tenderness. Musculoskeletal: No leg length discrepancy.  Moderate guarding palpation of the greater trochanter.  No joint effusions. Neurologic:  Normal speech and language. No gross focal neurologic deficits are appreciated. No gait instability. Skin:  Skin is warm, dry and intact. No rash noted. Psychiatric: Mood and affect are normal. Speech and behavior are normal.  ____________________________________________   LABS (all labs ordered are listed, but only abnormal results are displayed)  Labs Reviewed - No data to display ____________________________________________  EKG   ____________________________________________  RADIOLOGY  ED MD interpretation:   Official radiology report(s): Dg Hip Unilat W Or Wo Pelvis 2-3 Views Right  Result Date: 06/21/2017 CLINICAL DATA:  Right hip pain  for 1 month.  No known injury. EXAM: DG HIP (WITH OR WITHOUT PELVIS) 2-3V RIGHT COMPARISON:  None. FINDINGS: There is no evidence of hip fracture or dislocation. There is no evidence of arthropathy or other focal bone abnormality. Atherosclerosis is noted. IMPRESSION: Normal-appearing right hip. Atherosclerosis. Electronically Signed   By: Inge Rise M.D.   On: 06/21/2017 15:40    ____________________________________________   PROCEDURES  Procedure(s) performed: None  Procedures  Critical Care performed: No  ____________________________________________   INITIAL IMPRESSION / ASSESSMENT AND PLAN / ED COURSE  As part of my medical decision making, I reviewed the following data within the electronic MEDICAL RECORD NUMBER    Non-provocative right hip pain.  Discussed unremarkable x-ray findings with patient.  Patient advised continue previous medication.  Patient advised follow-up with scheduled orthopedic appointment next week.      ____________________________________________   FINAL CLINICAL IMPRESSION(S) / ED DIAGNOSES  Final diagnoses:  Right hip pain  ED Discharge Orders    None       Note:  This document was prepared using Dragon voice recognition software and may include unintentional dictation errors.    Sable Feil, PA-C 06/21/17 1602    Sable Feil, PA-C 06/21/17 1611    Schaevitz, Randall An, MD 06/22/17 [phone]

## 2017-06-21 NOTE — ED Triage Notes (Signed)
Sharp radiating right hip pain down right leg. No injury. Pt alert and oriented X4, active, cooperative, pt in NAD. RR even and unlabored, color WNL.

## 2017-06-21 NOTE — Discharge Instructions (Addendum)
Continue previous medication and follow-up with scheduled orthopedic appointment next week.

## 2017-06-25 ENCOUNTER — Ambulatory Visit: Payer: Self-pay | Admitting: Orthopaedic Surgery

## 2017-06-26 ENCOUNTER — Ambulatory Visit (INDEPENDENT_AMBULATORY_CARE_PROVIDER_SITE_OTHER): Payer: Medicare Other | Admitting: Orthopaedic Surgery

## 2017-06-26 ENCOUNTER — Encounter: Payer: Self-pay | Admitting: Orthopaedic Surgery

## 2017-06-26 ENCOUNTER — Ambulatory Visit (INDEPENDENT_AMBULATORY_CARE_PROVIDER_SITE_OTHER): Payer: Medicare Other

## 2017-06-26 DIAGNOSIS — G8929 Other chronic pain: Secondary | ICD-10-CM | POA: Diagnosis not present

## 2017-06-26 DIAGNOSIS — M5431 Sciatica, right side: Secondary | ICD-10-CM | POA: Diagnosis not present

## 2017-06-26 DIAGNOSIS — M5441 Lumbago with sciatica, right side: Secondary | ICD-10-CM

## 2017-06-26 MED ORDER — PREDNISONE 5 MG (21) PO TBPK: ORAL_TABLET | ORAL | 0 refills | Status: DC

## 2017-06-26 NOTE — Progress Notes (Signed)
Subjective:    Patient ID: Caitlin Yates, female    DOB: 08-19-1940, 77 y.o.   MRN: [id number]  HPI She has had hip pain on the right for about six to eight weeks.  She has pain that runs from the right hip to the thigh, to the knee and past the right knee to the lateral right foot.  She has numbness.  She has no trauma or weakness.  She went to the ER about her right hip pain on 06-21-17.  X-rays of the hip were negative.  She was given prednisone, flexeril and told to see her family doctor.  She was seen at [facility] and then referred here.  She says she is not better.  Her right hip pain is worse and she has more paresthesia.    I told her the pain is from her back not her hip and that she has sciatica on the right.  She disagrees.  She has a list of multiple medications but on talking with her she is not taking all of them.  I have corrected her medicine list.  I have asked her to bring in next time all of her medicines so I can check it.  I have asked her to go by the pharmacy and get a list of her current medicine as well so I can check that also.  Review of Systems  Constitutional: Positive for activity change.  Respiratory: Negative for cough and shortness of breath.   Cardiovascular: Negative for chest pain and leg swelling.  Endocrine: Positive for cold intolerance.  Musculoskeletal: Positive for arthralgias, back pain, gait problem and myalgias.  Allergic/Immunologic: Positive for environmental allergies.  Psychiatric/Behavioral: The patient is nervous/anxious.    Past Medical History:  Diagnosis Date  . Arthritis   . Diabetes mellitus without complication ([place])   . GERD (gastroesophageal reflux disease)   . Hypercholesterolemia   . Hypertension     Past Surgical History:  Procedure Laterality Date  . ABDOMINAL HYSTERECTOMY    . vascular stent right leg      Current Outpatient Medications on File Prior to Visit  Medication Sig Dispense Refill  .  acetaminophen (TYLENOL) 500 MG tablet Take 500 mg by mouth every 6 (six) hours as needed for mild pain or moderate pain.    Marland Kitchen amLODipine (NORVASC) 10 MG tablet TAKE 1 TABLET BY MOUTH ONCE DAILY. 90 tablet 3  . aspirin EC 81 MG tablet Take 81 mg by mouth every morning.    Marland Kitchen atenolol-chlorthalidone (TENORETIC) 50-25 MG per tablet Take 1 tablet by mouth every morning.    . clopidogrel (PLAVIX) 75 MG tablet Take 75 mg by mouth every morning.    . cyclobenzaprine (FLEXERIL) 10 MG tablet Take 10 mg by mouth 3 (three) times daily as needed for muscle spasms.    . DEXILANT 60 MG capsule Take 60 mg by mouth daily.     Marland Kitchen gabapentin (NEURONTIN) 300 MG capsule Take 300 mg by mouth 3 (three) times daily.    Marland Kitchen glimepiride (AMARYL) 4 MG tablet Take 4 mg by mouth daily with breakfast.     . HYDROcodone-acetaminophen (NORCO/VICODIN) 5-325 MG tablet Take 1 tablet by mouth every 6 (six) hours as needed for moderate pain.    . Multiple Vitamins-Minerals (CENTURY MATURE ADULT FORMULA PO) Take 1 tablet by mouth every morning.    . potassium chloride (K-DUR) 10 MEQ tablet Take 1 tablet (10 mEq total) by mouth daily. 7 tablet 0  .  sitaGLIPtan-metformin (JANUMET) 50-1000 MG per tablet Take 1 tablet by mouth 2 (two) times daily.    . naproxen (NAPROSYN) 500 MG tablet Take 500 mg by mouth every 12 (twelve) hours as needed.     . Vitamin D, Ergocalciferol, (DRISDOL) 50000 units CAPS capsule Take 50,000 Units by mouth every Wednesday.     No current facility-administered medications on file prior to visit.     Social History   Socioeconomic History  . Marital status: Legally Separated    Spouse name: Not on file  . Number of children: Not on file  . Years of education: Not on file  . Highest education level: Not on file  Occupational History  . Not on file  Social Needs  . Financial resource strain: Not on file  . Food insecurity:    Worry: Not on file    Inability: Not on file  . Transportation needs:     Medical: Not on file    Non-medical: Not on file  Tobacco Use  . Smoking status: Never Smoker  . Smokeless tobacco: Never Used  Substance and Sexual Activity  . Alcohol use: No  . Drug use: No  . Sexual activity: Not on file  Lifestyle  . Physical activity:    Days per week: Not on file    Minutes per session: Not on file  . Stress: Not on file  Relationships  . Social connections:    Talks on phone: Not on file    Gets together: Not on file    Attends religious service: Not on file    Active member of club or organization: Not on file    Attends meetings of clubs or organizations: Not on file    Relationship status: Not on file  . Intimate partner violence:    Fear of current or ex partner: Not on file    Emotionally abused: Not on file    Physically abused: Not on file    Forced sexual activity: Not on file  Other Topics Concern  . Not on file  Social History Narrative  . Not on file    Family History  Problem Relation Age of Onset  . CVA Mother   . Hypertension Mother   . Heart attack Brother   . Colon cancer Neg Hx     There were no vitals taken for this visit.     Objective:   Physical Exam  Constitutional: She is oriented to person, place, and time. She appears well-developed and well-nourished.  HENT:  Head: Normocephalic and atraumatic.  Eyes: Pupils are equal, round, and reactive to light. Conjunctivae and EOM are normal.  Neck: Normal range of motion. Neck supple.  Cardiovascular: Normal rate, regular rhythm and intact distal pulses.  Pulmonary/Chest: Effort normal.  Abdominal: Soft.  Musculoskeletal:       Lumbar back: She exhibits decreased range of motion, tenderness and pain.       Back:  Neurological: She is alert and oriented to person, place, and time. She has normal reflexes. She displays normal reflexes. No cranial nerve deficit. She exhibits normal muscle tone. Coordination normal.  Skin: Skin is warm and dry.  Psychiatric: She has a  normal mood and affect. Her behavior is normal. Judgment and thought content normal.    X-rays were done of the lumbar spine, reported separately.  I have reviewed notes from the ER and the x-rays and reports.  I have reviewed notes from her family physician.    Assessment &  Plan:   Encounter Diagnoses  Name Primary?  . Chronic right-sided low back pain with right-sided sciatica   . Sciatica of right side Yes   I have explained the findings to her and told her the pain is from the back which radiates to the hip and lower leg on the right.  She disagrees but tends to accept what I have said.  Her daughters present appear to understand.  I have recommended PT in [place].  Begin this week.  I have told her to bring in all her medicine.  I have sent in Rx for Prednisone dose pack.  Return in one week.  Call if any problem.  Precautions discussed.   Electronically Signed Sanjuana Kava, MD 6/12/201911:09 AM

## 2017-07-11 ENCOUNTER — Encounter: Payer: Self-pay | Admitting: Orthopaedic Surgery

## 2017-07-11 ENCOUNTER — Ambulatory Visit (INDEPENDENT_AMBULATORY_CARE_PROVIDER_SITE_OTHER): Payer: Medicare Other | Admitting: Orthopaedic Surgery

## 2017-07-11 VITALS — BP 141/70 | HR 82 | Ht 62.0 in | Wt 158.0 lb

## 2017-07-11 DIAGNOSIS — M5441 Lumbago with sciatica, right side: Secondary | ICD-10-CM | POA: Diagnosis not present

## 2017-07-11 DIAGNOSIS — G8929 Other chronic pain: Secondary | ICD-10-CM

## 2017-07-11 DIAGNOSIS — M5136 Other intervertebral disc degeneration, lumbar region: Secondary | ICD-10-CM | POA: Diagnosis not present

## 2017-07-11 NOTE — Patient Instructions (Signed)
Your MRI is scheduled for Wed July 3rd at 9 am, arrive at [facility] for the study at 8:30

## 2017-07-11 NOTE — Progress Notes (Signed)
Patient Caitlin Yates, female DOB:Jun 30, 1940, 77 y.o. XIP:[id number]  Chief Complaint  Patient presents with  . Hip Pain    right     HPI  KEILYNN MARANO is a 77 y.o. female who has had right hip pain and lower back pain.  She has right sided sciatica.  She has been to PT and is somewhat better but still has the pain in the right hip area and the right sciatica.  She is taking her medicine.  She did bring in her list of current medicine from the pharmacy and we have adjusted her medicine list.  I would like to get a MRI of the lumbar spine. I am concerned she may have a HNP on the right side that will need surgery.  She is better but not all that much. HPI  Body mass index is 28.9 kg/m.  ROS  Review of Systems  Constitutional: Positive for activity change.  Respiratory: Negative for cough and shortness of breath.   Cardiovascular: Negative for chest pain and leg swelling.  Endocrine: Positive for cold intolerance.  Musculoskeletal: Positive for arthralgias, back pain, gait problem and myalgias.  Allergic/Immunologic: Positive for environmental allergies.  Psychiatric/Behavioral: The patient is nervous/anxious.     Past Medical History:  Diagnosis Date  . Arthritis   . Diabetes mellitus without complication ([place])   . GERD (gastroesophageal reflux disease)   . Hypercholesterolemia   . Hypertension     Past Surgical History:  Procedure Laterality Date  . ABDOMINAL HYSTERECTOMY    . vascular stent right leg      Family History  Problem Relation Age of Onset  . CVA Mother   . Hypertension Mother   . Heart attack Brother   . Colon cancer Neg Hx     Social History Social History   Tobacco Use  . Smoking status: Never Smoker  . Smokeless tobacco: Never Used  Substance Use Topics  . Alcohol use: No  . Drug use: No    Allergies  Allergen Reactions  . Lisinopril Swelling    Tongue swelling  . Statins     Muscle aches    Current Outpatient Medications   Medication Sig Dispense Refill  . amLODipine (NORVASC) 10 MG tablet TAKE 1 TABLET BY MOUTH ONCE DAILY. 90 tablet 3  . atenolol-chlorthalidone (TENORETIC) 50-25 MG per tablet Take 1 tablet by mouth every morning.    Marland Kitchen atorvastatin (LIPITOR) 10 MG tablet Take 10 mg by mouth daily.    . clopidogrel (PLAVIX) 75 MG tablet Take 75 mg by mouth every morning.    . cyclobenzaprine (FLEXERIL) 10 MG tablet Take 10 mg by mouth 3 (three) times daily as needed for muscle spasms.    . DEXILANT 60 MG capsule Take 60 mg by mouth daily.     Marland Kitchen gabapentin (NEURONTIN) 300 MG capsule Take 300 mg by mouth 3 (three) times daily.    Marland Kitchen glimepiride (AMARYL) 4 MG tablet Take 4 mg by mouth daily with breakfast.     . HYDROcodone-acetaminophen (NORCO/VICODIN) 5-325 MG tablet Take 1 tablet by mouth every 6 (six) hours as needed for moderate pain.    . naproxen (NAPROSYN) 500 MG tablet Take 500 mg by mouth every 12 (twelve) hours as needed.     . potassium chloride (K-DUR) 10 MEQ tablet Take 1 tablet (10 mEq total) by mouth daily. 7 tablet 0  . predniSONE (STERAPRED UNI-PAK 21 TAB) 5 MG (21) TBPK tablet Take 6 pills first day; 5 pills  second day; 4 pills third day; 3 pills fourth day; 2 pills next day and 1 pill last day. 21 tablet 0  . sitaGLIPtan-metformin (JANUMET) 50-1000 MG per tablet Take 1 tablet by mouth 2 (two) times daily.    Marland Kitchen spironolactone (ALDACTONE) 25 MG tablet Take 25 mg by mouth daily.    . Vitamin D, Ergocalciferol, (DRISDOL) 50000 units CAPS capsule Take 50,000 Units by mouth every Wednesday.    Marland Kitchen acetaminophen (TYLENOL) 500 MG tablet Take 500 mg by mouth every 6 (six) hours as needed for mild pain or moderate pain.    Marland Kitchen aspirin EC 81 MG tablet Take 81 mg by mouth every morning.    . Multiple Vitamins-Minerals (CENTURY MATURE ADULT FORMULA PO) Take 1 tablet by mouth every morning.     No current facility-administered medications for this visit.      Physical Exam  Blood pressure (!) 141/70, pulse 82,  height 5\' 2"  (1.575 m), weight 158 lb (71.7 kg).  Constitutional: overall normal hygiene, normal nutrition, well developed, normal grooming, normal body habitus. Assistive device:none  Musculoskeletal: gait and station Limp right, muscle tone and strength are normal, no tremors or atrophy is present.  .  Neurological: coordination overall normal.  Deep tendon reflex/nerve stretch intact.  Sensation normal.  Cranial nerves II-XII intact.   Skin:   Normal overall no scars, lesions, ulcers or rashes. No psoriasis.  Psychiatric: Alert and oriented x 3.  Recent memory intact, remote memory unclear.  Normal mood and affect. Well groomed.  Good eye contact.  Cardiovascular: overall no swelling, no varicosities, no edema bilaterally, normal temperatures of the legs and arms, no clubbing, cyanosis and good capillary refill.  Lymphatic: palpation is normal.  Spine/Pelvis examination:  Inspection:  Overall, sacoiliac joint benign and hips nontender; without crepitus or defects.   Thoracic spine inspection: Alignment normal without kyphosis present   Lumbar spine inspection:  Alignment  with normal lumbar lordosis, without scoliosis apparent.   Thoracic spine palpation:  without tenderness of spinal processes   Lumbar spine palpation: with tenderness of lumbar area; with tightness of lumbar muscles    Range of Motion:   Lumbar flexion, forward flexion is 35 without pain or tenderness    Lumbar extension is 5 without pain or tenderness   Left lateral bend is Normal  without pain or tenderness   Right lateral bend is Normal without pain or tenderness   Straight leg raising is Abnormal- at 30 degrees on the right   Strength & tone: Normal   Stability overall normal stability    All other systems reviewed and are negative   The patient has been educated about the nature of the problem(s) and counseled on treatment options.  The patient appeared to understand what I have discussed and is in  agreement with it.  Encounter Diagnoses  Name Primary?  . Other intervertebral disc degeneration, lumbar region   . Chronic right-sided low back pain with right-sided sciatica Yes    PLAN Call if any problems.  Precautions discussed.  Continue current medications.   Return to clinic after MRI of the lumbar spine.  continue PT.   Electronically Signed Sanjuana Kava, MD 6/27/20193:56 PM

## 2017-07-16 ENCOUNTER — Ambulatory Visit (HOSPITAL_COMMUNITY)
Admission: RE | Admit: 2017-07-16 | Discharge: 2017-07-16 | Disposition: A | Discharge status: Home / Self Care | Payer: Medicare Other | Source: Ambulatory Visit | Attending: Orthopaedic Surgery | Admitting: Orthopaedic Surgery

## 2017-07-16 DIAGNOSIS — M5136 Other intervertebral disc degeneration, lumbar region: Secondary | ICD-10-CM | POA: Insufficient documentation

## 2017-07-16 DIAGNOSIS — M5126 Other intervertebral disc displacement, lumbar region: Secondary | ICD-10-CM | POA: Insufficient documentation

## 2017-07-16 DIAGNOSIS — M4316 Spondylolisthesis, lumbar region: Secondary | ICD-10-CM | POA: Insufficient documentation

## 2017-07-16 DIAGNOSIS — M5127 Other intervertebral disc displacement, lumbosacral region: Secondary | ICD-10-CM | POA: Insufficient documentation

## 2017-07-16 DIAGNOSIS — M48061 Spinal stenosis, lumbar region without neurogenic claudication: Secondary | ICD-10-CM | POA: Insufficient documentation

## 2017-07-17 ENCOUNTER — Ambulatory Visit (HOSPITAL_COMMUNITY): Payer: Medicare Other

## 2017-07-17 ENCOUNTER — Encounter: Payer: Self-pay | Admitting: Orthopaedic Surgery

## 2017-07-17 ENCOUNTER — Ambulatory Visit (INDEPENDENT_AMBULATORY_CARE_PROVIDER_SITE_OTHER): Payer: Medicare Other | Admitting: Orthopaedic Surgery

## 2017-07-17 VITALS — BP 97/67 | HR 97 | Ht 62.0 in | Wt 157.0 lb

## 2017-07-17 DIAGNOSIS — M5441 Lumbago with sciatica, right side: Secondary | ICD-10-CM

## 2017-07-17 DIAGNOSIS — G8929 Other chronic pain: Secondary | ICD-10-CM

## 2017-07-17 NOTE — Progress Notes (Signed)
Patient Caitlin Yates, female DOB:1940/08/01, 77 y.o. TML:[id number]  Chief Complaint  Patient presents with  . Back Pain    lumbar    HPI  Caitlin Yates is a 77 y.o. female who has right sided sciatica and right hip pain.  She is not improved.  She had MRI done and it showed: IMPRESSION: 1. Severe spinal stenosis at L4-5 with trapping of the nerve roots due to facet and ligamentum flavum hypertrophy and grade 1 spondylolisthesis combined with a broad-based disc bulge, disc protrusion and disc protrusion. 2. Disc extrusion to the right of midline at L4-5 and L5-S1 extending into the right L5-S1 neural foramen could originate from the L5-S1 disc or from the L4-5 disc.  I have explained the findings to her. I have recommended she see a neurosurgeon for further evaluation.  Body mass index is 28.72 kg/m.  ROS  Review of Systems  All other systems reviewed and are negative.  Past Medical History:  Diagnosis Date  . Arthritis   . Diabetes mellitus without complication ([place])   . GERD (gastroesophageal reflux disease)   . Hypercholesterolemia   . Hypertension     Past Surgical History:  Procedure Laterality Date  . ABDOMINAL HYSTERECTOMY    . vascular stent right leg      Family History  Problem Relation Age of Onset  . CVA Mother   . Hypertension Mother   . Heart attack Brother   . Colon cancer Neg Hx     Social History Social History   Tobacco Use  . Smoking status: Never Smoker  . Smokeless tobacco: Never Used  Substance Use Topics  . Alcohol use: No  . Drug use: No    Allergies  Allergen Reactions  . Lisinopril Swelling    Tongue swelling  . Statins     Muscle aches    Current Outpatient Medications  Medication Sig Dispense Refill  . acetaminophen (TYLENOL) 500 MG tablet Take 500 mg by mouth every 6 (six) hours as needed for mild pain or moderate pain.    Marland Kitchen amLODipine (NORVASC) 10 MG tablet TAKE 1 TABLET BY MOUTH ONCE DAILY. 90 tablet 3  .  aspirin EC 81 MG tablet Take 81 mg by mouth every morning.    Marland Kitchen atenolol-chlorthalidone (TENORETIC) 50-25 MG per tablet Take 1 tablet by mouth every morning.    Marland Kitchen atorvastatin (LIPITOR) 10 MG tablet Take 10 mg by mouth daily.    . clopidogrel (PLAVIX) 75 MG tablet Take 75 mg by mouth every morning.    . cyclobenzaprine (FLEXERIL) 10 MG tablet Take 10 mg by mouth 3 (three) times daily as needed for muscle spasms.    . DEXILANT 60 MG capsule Take 60 mg by mouth daily.     Marland Kitchen gabapentin (NEURONTIN) 300 MG capsule Take 300 mg by mouth 3 (three) times daily.    Marland Kitchen glimepiride (AMARYL) 4 MG tablet Take 4 mg by mouth daily with breakfast.     . HYDROcodone-acetaminophen (NORCO/VICODIN) 5-325 MG tablet Take 1 tablet by mouth every 6 (six) hours as needed for moderate pain.    . Multiple Vitamins-Minerals (CENTURY MATURE ADULT FORMULA PO) Take 1 tablet by mouth every morning.    . naproxen (NAPROSYN) 500 MG tablet Take 500 mg by mouth every 12 (twelve) hours as needed.     . potassium chloride (K-DUR) 10 MEQ tablet Take 1 tablet (10 mEq total) by mouth daily. 7 tablet 0  . predniSONE (STERAPRED UNI-PAK 21 TAB) 5  MG (21) TBPK tablet Take 6 pills first day; 5 pills second day; 4 pills third day; 3 pills fourth day; 2 pills next day and 1 pill last day. 21 tablet 0  . sitaGLIPtan-metformin (JANUMET) 50-1000 MG per tablet Take 1 tablet by mouth 2 (two) times daily.    Marland Kitchen spironolactone (ALDACTONE) 25 MG tablet Take 25 mg by mouth daily.    . Vitamin D, Ergocalciferol, (DRISDOL) 50000 units CAPS capsule Take 50,000 Units by mouth every Wednesday.     No current facility-administered medications for this visit.      Physical Exam  Blood pressure 97/67, pulse 97, height 5\' 2"  (1.575 m), weight 157 lb (71.2 kg).  Constitutional: overall normal hygiene, normal nutrition, well developed, normal grooming, normal body habitus. Assistive device:none  Musculoskeletal: gait and station Limp none, muscle tone and  strength are normal, no tremors or atrophy is present.  .  Neurological: coordination overall normal.  Deep tendon reflex/nerve stretch intact.  Sensation normal.  Cranial nerves II-XII intact.   Skin:   Normal overall no scars, lesions, ulcers or rashes. No psoriasis.  Psychiatric: Alert and oriented x 3.  Recent memory intact, remote memory unclear.  Normal mood and affect. Well groomed.  Good eye contact.  Cardiovascular: overall no swelling, no varicosities, no edema bilaterally, normal temperatures of the legs and arms, no clubbing, cyanosis and good capillary refill.  Lymphatic: palpation is normal.  She has lower back pain on the right with tightness.  NV intact.  She has weakly positive SLR on the right at 30 degrees.    All other systems reviewed and are negative   The patient has been educated about the nature of the problem(s) and counseled on treatment options.  The patient appeared to understand what I have discussed and is in agreement with it.  Encounter Diagnosis  Name Primary?  . Chronic right-sided low back pain with right-sided sciatica Yes    PLAN Call if any problems.  Precautions discussed.  Continue current medications.   Return to clinic to neurosurgeon   Electronically Signed Sanjuana Kava, MD 7/3/201910:37 AM

## 2017-07-23 ENCOUNTER — Ambulatory Visit: Payer: Medicare Other | Admitting: Orthopaedic Surgery

## 2017-09-03 ENCOUNTER — Other Ambulatory Visit: Payer: Self-pay | Admitting: Cardiology

## 2017-09-23 ENCOUNTER — Ambulatory Visit (HOSPITAL_COMMUNITY)
Admission: RE | Admit: 2017-09-23 | Discharge: 2017-09-23 | Disposition: A | Discharge status: Home / Self Care | Payer: Medicare Other | Source: Ambulatory Visit | Attending: Nurse Practitioner | Admitting: Nurse Practitioner

## 2017-09-23 ENCOUNTER — Encounter (HOSPITAL_COMMUNITY): Payer: Self-pay

## 2017-09-23 DIAGNOSIS — Z1231 Encounter for screening mammogram for malignant neoplasm of breast: Secondary | ICD-10-CM | POA: Diagnosis not present

## 2017-11-29 ENCOUNTER — Ambulatory Visit: Payer: Medicare Other | Admitting: Cardiology

## 2018-01-24 ENCOUNTER — Ambulatory Visit (INDEPENDENT_AMBULATORY_CARE_PROVIDER_SITE_OTHER): Payer: Medicare Other | Admitting: Cardiology

## 2018-01-24 ENCOUNTER — Encounter: Payer: Self-pay | Admitting: Cardiology

## 2018-01-24 VITALS — BP 124/70 | HR 89 | Ht 62.0 in | Wt 165.0 lb

## 2018-01-24 DIAGNOSIS — I10 Essential (primary) hypertension: Secondary | ICD-10-CM | POA: Diagnosis not present

## 2018-01-24 DIAGNOSIS — I739 Peripheral vascular disease, unspecified: Secondary | ICD-10-CM | POA: Diagnosis not present

## 2018-01-24 DIAGNOSIS — I2510 Atherosclerotic heart disease of native coronary artery without angina pectoris: Secondary | ICD-10-CM | POA: Diagnosis not present

## 2018-01-24 DIAGNOSIS — E782 Mixed hyperlipidemia: Secondary | ICD-10-CM | POA: Diagnosis not present

## 2018-01-24 NOTE — Patient Instructions (Signed)

## 2018-01-24 NOTE — Progress Notes (Signed)
Clinical Summary Ms. Drenning is a 78 y.o.female seen today for follow up of the following medical problems.   1. PAD - previously followed in [place] for her PAD - reports prior history of claudication. She states she had a cath around 2011 or 2012, and she believes a stent placed at [facility]. Since that time symptoms have resolved.  - from records available appears she did have cath [place] 10/2010 with Left SFA with flap and 50-60% disease with 30% mid disease and 75% disease just prox to SFA. Occluded PTA, peroneal 75%. Prox right SFA 75%, followed by multiple tandem lesions 50% and more distal 70-75%. Occluded PTA, ATA and peroneal 60-70%.  - she had left common iliac stenting and right atherectomy of SFA with stenting.    - 10/2015 normal ABIs - no recent leg pains.    2. DM2 - followed by pcp  3. HTN - angioedema on ACE-I, she is now off  - compliant with meds - home bp's 120/70s   4. Hyperlipidemia - side effects on atorva 80, has tolerated 40mg  daily labs followed by pcp  5. Carotid stenosis - [place] 08/2014 with only mild stenosis  6. 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 Arthritis   . Diabetes mellitus without complication ([place])   . GERD (gastroesophageal reflux disease)   . Hypercholesterolemia   . Hypertension      Allergies  Allergen Reactions  . Lisinopril Swelling    Tongue swelling  . Statins     Muscle aches     Current Outpatient Medications  Medication Sig Dispense Refill  . acetaminophen (TYLENOL) 500 MG tablet Take 500 mg by mouth every 6 (six) hours as needed for mild pain or moderate pain.    Marland Kitchen amLODipine (NORVASC) 10 MG tablet TAKE 1 TABLET BY MOUTH ONCE DAILY. 90 tablet 3  . aspirin EC 81 MG tablet Take 81 mg by mouth every morning.    Marland Kitchen atenolol-chlorthalidone (TENORETIC) 50-25 MG per tablet Take 1 tablet by mouth every morning.    Marland Kitchen  atorvastatin (LIPITOR) 10 MG tablet Take 10 mg by mouth daily.    . clopidogrel (PLAVIX) 75 MG tablet Take 75 mg by mouth every morning.    . cyclobenzaprine (FLEXERIL) 10 MG tablet Take 10 mg by mouth 3 (three) times daily as needed for muscle spasms.    . DEXILANT 60 MG capsule Take 60 mg by mouth daily.     Marland Kitchen gabapentin (NEURONTIN) 300 MG capsule Take 300 mg by mouth 3 (three) times daily.    Marland Kitchen glimepiride (AMARYL) 4 MG tablet Take 4 mg by mouth daily with breakfast.     . HYDROcodone-acetaminophen (NORCO/VICODIN) 5-325 MG tablet Take 1 tablet by mouth every 6 (six) hours as needed for moderate pain.    . Multiple Vitamins-Minerals (CENTURY MATURE ADULT FORMULA PO) Take 1 tablet by mouth every morning.    . naproxen (NAPROSYN) 500 MG tablet Take 500 mg by mouth every 12 (twelve) hours as needed.     . potassium chloride (K-DUR) 10 MEQ tablet Take 1 tablet (10 mEq total) by mouth daily. 7 tablet 0  . predniSONE (STERAPRED UNI-PAK 21 TAB) 5 MG (21) TBPK tablet Take 6 pills first day; 5 pills second day; 4 pills third day; 3 pills fourth day; 2 pills next day and 1 pill last day. 21 tablet 0  . sitaGLIPtan-metformin (JANUMET)  50-1000 MG per tablet Take 1 tablet by mouth 2 (two) times daily.    Marland Kitchen spironolactone (ALDACTONE) 25 MG tablet Take 25 mg by mouth daily.    Marland Kitchen spironolactone (ALDACTONE) 25 MG tablet TAKE (1/2) TABLET BY MOUTH ONCE DAILY. 45 tablet 0  . Vitamin D, Ergocalciferol, (DRISDOL) 50000 units CAPS capsule Take 50,000 Units by mouth every Wednesday.     No current facility-administered medications for this visit.      Past Surgical History:  Procedure Laterality Date  . ABDOMINAL HYSTERECTOMY    . vascular stent right leg       Allergies  Allergen Reactions  . Lisinopril Swelling    Tongue swelling  . Statins     Muscle aches      Family History  Problem Relation Age of Onset  . CVA Mother   . Hypertension Mother   . Heart attack Brother   . Colon cancer Neg Hx       Social History Ms. Raffo reports that she has never smoked. She has never used smokeless tobacco. Ms. Mossberg reports no history of alcohol use.   Review of Systems CONSTITUTIONAL: No weight loss, fever, chills, weakness or fatigue.  HEENT: Eyes: No visual loss, blurred vision, double vision or yellow sclerae.No hearing loss, sneezing, congestion, runny nose or sore throat.  SKIN: No rash or itching.  CARDIOVASCULAR: per hpi RESPIRATORY: No shortness of breath, cough or sputum.  GASTROINTESTINAL: No anorexia, nausea, vomiting or diarrhea. No abdominal pain or blood.  GENITOURINARY: No burning on urination, no polyuria NEUROLOGICAL: No headache, dizziness, syncope, paralysis, ataxia, numbness or tingling in the extremities. No change in bowel or bladder control.  MUSCULOSKELETAL: No muscle, back pain, joint pain or stiffness.  LYMPHATICS: No enlarged nodes. No history of splenectomy.  PSYCHIATRIC: No history of depression or anxiety.  ENDOCRINOLOGIC: No reports of sweating, cold or heat intolerance. No polyuria or polydipsia.  Marland Kitchen   Physical Examination Vitals:   01/24/18 1139  BP: 124/70  Pulse: 89  SpO2: 98%   Vitals:   01/24/18 1139  Weight: 165 lb (74.8 kg)  Height: 5\' 2"  (1.575 m)    Gen: resting comfortably, no acute distress HEENT: no scleral icterus, pupils equal round and reactive, no palptable cervical adenopathy,  CV: RRR, no m/r/g, no jvd Resp: Clear to auscultation bilaterally GI: abdomen is soft, non-tender, non-distended, normal bowel sounds, no hepatosplenomegaly MSK: extremities are warm, no edema.  Skin: warm, no rash Neuro:  no focal deficits Psych: appropriate affect   Diagnostic Studies 07/2014 ABI FINDINGS: Right ABI: Non calculable due to vascular noncompressibility  Left ABI: 1.66, probably elevated secondary to vascular calcification limiting compressibility  Right Lower Extremity: There is at biphasic waveform to the popliteal  level. Monophasic waveforms distally.  Left Lower Extremity: Biphasic waveform through the popliteal artery, monophasic distally.  Pulse volume recording is relatively symmetric in amplitude at the ankle and metatarsal level.  IMPRESSION: 1. Lower extremity arterial occlusive disease, with limited evaluation secondary to vascular noncompressibility. Should the patient fail conservative treatment, consider CTA runoff to better define the site and nature of arterial occlusive disease and delineate treatment options.  10/2010 Cath [place] LM patent, LAD patent, LCX 30% prox, OM1 40%, RCA 50-% proximal  08/2014 Carotid US IMPRESSION: 1. Mild (1-49%) stenosis proximal right internal carotid artery secondary to mild smooth heterogeneous atherosclerotic plaque. 2. Trace focal atherosclerotic plaque in the left internal carotid artery without evidence of stenosis. 3. Vertebral arteries are patent with normal  antegrade flow. 4. Intermittent irregularity of the cardiac rhythm noted incidentally. Does the patient have a history of intermittent atrial fibrillation?    Assessment and Plan  1. PAD --  normal ABIs last year - no symptoms, continue medical therapy.   2. HTN - at goal, continue current meds  3. Hyperlipidemia - continue statin, request labs from pcp   4. CAD - nonobsructive CAD by cath in [place] in 2012.  No recent symptoms, continue to monitor  - EKG today SR no acute ischemic changes/   F/u1 year      Arnoldo Lenis, M.D.

## 2018-03-15 ENCOUNTER — Emergency Department (HOSPITAL_COMMUNITY): Payer: Medicare Other

## 2018-03-15 ENCOUNTER — Encounter (HOSPITAL_COMMUNITY): Payer: Self-pay | Admitting: Emergency Medicine

## 2018-03-15 ENCOUNTER — Inpatient Hospital Stay (HOSPITAL_COMMUNITY)
Admission: EM | Admit: 2018-03-15 | Discharge: 2018-04-16 | DRG: 871 | Disposition: E | Payer: Medicare Other | Attending: Pulmonary Disease | Admitting: Pulmonary Disease

## 2018-03-15 ENCOUNTER — Other Ambulatory Visit: Payer: Self-pay

## 2018-03-15 DIAGNOSIS — R34 Anuria and oliguria: Secondary | ICD-10-CM | POA: Diagnosis not present

## 2018-03-15 DIAGNOSIS — E1110 Type 2 diabetes mellitus with ketoacidosis without coma: Secondary | ICD-10-CM | POA: Diagnosis present

## 2018-03-15 DIAGNOSIS — I129 Hypertensive chronic kidney disease with stage 1 through stage 4 chronic kidney disease, or unspecified chronic kidney disease: Secondary | ICD-10-CM | POA: Diagnosis not present

## 2018-03-15 DIAGNOSIS — J9690 Respiratory failure, unspecified, unspecified whether with hypoxia or hypercapnia: Secondary | ICD-10-CM

## 2018-03-15 DIAGNOSIS — R7401 Elevation of levels of liver transaminase levels: Secondary | ICD-10-CM

## 2018-03-15 DIAGNOSIS — J9601 Acute respiratory failure with hypoxia: Secondary | ICD-10-CM | POA: Diagnosis not present

## 2018-03-15 DIAGNOSIS — E2740 Unspecified adrenocortical insufficiency: Secondary | ICD-10-CM | POA: Diagnosis present

## 2018-03-15 DIAGNOSIS — G9341 Metabolic encephalopathy: Secondary | ICD-10-CM | POA: Diagnosis not present

## 2018-03-15 DIAGNOSIS — K219 Gastro-esophageal reflux disease without esophagitis: Secondary | ICD-10-CM | POA: Diagnosis present

## 2018-03-15 DIAGNOSIS — N179 Acute kidney failure, unspecified: Secondary | ICD-10-CM

## 2018-03-15 DIAGNOSIS — R791 Abnormal coagulation profile: Secondary | ICD-10-CM | POA: Diagnosis not present

## 2018-03-15 DIAGNOSIS — N3001 Acute cystitis with hematuria: Secondary | ICD-10-CM | POA: Diagnosis present

## 2018-03-15 DIAGNOSIS — Z7902 Long term (current) use of antithrombotics/antiplatelets: Secondary | ICD-10-CM

## 2018-03-15 DIAGNOSIS — Z9911 Dependence on respirator [ventilator] status: Secondary | ICD-10-CM

## 2018-03-15 DIAGNOSIS — D709 Neutropenia, unspecified: Secondary | ICD-10-CM | POA: Diagnosis present

## 2018-03-15 DIAGNOSIS — E1122 Type 2 diabetes mellitus with diabetic chronic kidney disease: Secondary | ICD-10-CM | POA: Diagnosis present

## 2018-03-15 DIAGNOSIS — D631 Anemia in chronic kidney disease: Secondary | ICD-10-CM | POA: Diagnosis not present

## 2018-03-15 DIAGNOSIS — E11649 Type 2 diabetes mellitus with hypoglycemia without coma: Secondary | ICD-10-CM | POA: Diagnosis not present

## 2018-03-15 DIAGNOSIS — R579 Shock, unspecified: Secondary | ICD-10-CM

## 2018-03-15 DIAGNOSIS — E785 Hyperlipidemia, unspecified: Secondary | ICD-10-CM | POA: Diagnosis not present

## 2018-03-15 DIAGNOSIS — J80 Acute respiratory distress syndrome: Secondary | ICD-10-CM

## 2018-03-15 DIAGNOSIS — I10 Essential (primary) hypertension: Secondary | ICD-10-CM | POA: Diagnosis not present

## 2018-03-15 DIAGNOSIS — A419 Sepsis, unspecified organism: Secondary | ICD-10-CM | POA: Diagnosis present

## 2018-03-15 DIAGNOSIS — E7800 Pure hypercholesterolemia, unspecified: Secondary | ICD-10-CM | POA: Diagnosis not present

## 2018-03-15 DIAGNOSIS — R6521 Severe sepsis with septic shock: Secondary | ICD-10-CM | POA: Diagnosis present

## 2018-03-15 DIAGNOSIS — E872 Acidosis, unspecified: Secondary | ICD-10-CM | POA: Diagnosis present

## 2018-03-15 DIAGNOSIS — Z7952 Long term (current) use of systemic steroids: Secondary | ICD-10-CM

## 2018-03-15 DIAGNOSIS — Z888 Allergy status to other drugs, medicaments and biological substances status: Secondary | ICD-10-CM

## 2018-03-15 DIAGNOSIS — Z8249 Family history of ischemic heart disease and other diseases of the circulatory system: Secondary | ICD-10-CM

## 2018-03-15 DIAGNOSIS — E119 Type 2 diabetes mellitus without complications: Secondary | ICD-10-CM | POA: Diagnosis not present

## 2018-03-15 DIAGNOSIS — Z66 Do not resuscitate: Secondary | ICD-10-CM | POA: Diagnosis present

## 2018-03-15 DIAGNOSIS — R740 Nonspecific elevation of levels of transaminase and lactic acid dehydrogenase [LDH]: Secondary | ICD-10-CM

## 2018-03-15 DIAGNOSIS — Z95828 Presence of other vascular implants and grafts: Secondary | ICD-10-CM

## 2018-03-15 DIAGNOSIS — E876 Hypokalemia: Secondary | ICD-10-CM | POA: Diagnosis present

## 2018-03-15 DIAGNOSIS — N170 Acute kidney failure with tubular necrosis: Secondary | ICD-10-CM | POA: Diagnosis not present

## 2018-03-15 DIAGNOSIS — D62 Acute posthemorrhagic anemia: Secondary | ICD-10-CM | POA: Diagnosis present

## 2018-03-15 DIAGNOSIS — Z7982 Long term (current) use of aspirin: Secondary | ICD-10-CM

## 2018-03-15 DIAGNOSIS — Z79899 Other long term (current) drug therapy: Secondary | ICD-10-CM

## 2018-03-15 DIAGNOSIS — Z90710 Acquired absence of both cervix and uterus: Secondary | ICD-10-CM

## 2018-03-15 DIAGNOSIS — K7200 Acute and subacute hepatic failure without coma: Secondary | ICD-10-CM | POA: Diagnosis present

## 2018-03-15 DIAGNOSIS — N390 Urinary tract infection, site not specified: Secondary | ICD-10-CM | POA: Diagnosis not present

## 2018-03-15 DIAGNOSIS — N183 Chronic kidney disease, stage 3 (moderate): Secondary | ICD-10-CM | POA: Diagnosis present

## 2018-03-15 DIAGNOSIS — Z452 Encounter for adjustment and management of vascular access device: Secondary | ICD-10-CM

## 2018-03-15 DIAGNOSIS — K810 Acute cholecystitis: Secondary | ICD-10-CM | POA: Diagnosis present

## 2018-03-15 DIAGNOSIS — D72819 Decreased white blood cell count, unspecified: Secondary | ICD-10-CM

## 2018-03-15 DIAGNOSIS — D649 Anemia, unspecified: Secondary | ICD-10-CM

## 2018-03-15 DIAGNOSIS — B9620 Unspecified Escherichia coli [E. coli] as the cause of diseases classified elsewhere: Secondary | ICD-10-CM | POA: Diagnosis present

## 2018-03-15 DIAGNOSIS — Z7984 Long term (current) use of oral hypoglycemic drugs: Secondary | ICD-10-CM

## 2018-03-15 DIAGNOSIS — M1990 Unspecified osteoarthritis, unspecified site: Secondary | ICD-10-CM | POA: Insufficient documentation

## 2018-03-15 DIAGNOSIS — R1011 Right upper quadrant pain: Secondary | ICD-10-CM

## 2018-03-15 DIAGNOSIS — E875 Hyperkalemia: Secondary | ICD-10-CM | POA: Diagnosis not present

## 2018-03-15 DIAGNOSIS — Z978 Presence of other specified devices: Secondary | ICD-10-CM

## 2018-03-15 LAB — CBC WITH DIFFERENTIAL/PLATELET
ABS IMMATURE GRANULOCYTES: 0.01 10*3/uL (ref 0.00–0.07)
BASOS PCT: 1 %
Basophils Absolute: 0 10*3/uL (ref 0.0–0.1)
Eosinophils Absolute: 0 10*3/uL (ref 0.0–0.5)
Eosinophils Relative: 0 %
HCT: 33.6 % — ABNORMAL LOW (ref 36.0–46.0)
Hemoglobin: 10.7 g/dL — ABNORMAL LOW (ref 12.0–15.0)
Immature Granulocytes: 1 %
Lymphocytes Relative: 27 %
Lymphs Abs: 0.3 10*3/uL — ABNORMAL LOW (ref 0.7–4.0)
MCH: 29.5 pg (ref 26.0–34.0)
MCHC: 31.8 g/dL (ref 30.0–36.0)
MCV: 92.6 fL (ref 80.0–100.0)
MONOS PCT: 2 %
Monocytes Absolute: 0 10*3/uL — ABNORMAL LOW (ref 0.1–1.0)
Neutro Abs: 0.7 10*3/uL — ABNORMAL LOW (ref 1.7–7.7)
Neutrophils Relative %: 69 %
Platelets: 404 10*3/uL — ABNORMAL HIGH (ref 150–400)
RBC: 3.63 MIL/uL — ABNORMAL LOW (ref 3.87–5.11)
RDW: 13.1 % (ref 11.5–15.5)
WBC: 1 10*3/uL — CL (ref 4.0–10.5)
nRBC: 0 % (ref 0.0–0.2)

## 2018-03-15 LAB — URINALYSIS, ROUTINE W REFLEX MICROSCOPIC
Bilirubin Urine: NEGATIVE
Glucose, UA: 50 mg/dL — AB
KETONES UR: 5 mg/dL — AB
Nitrite: NEGATIVE
Protein, ur: 100 mg/dL — AB
Specific Gravity, Urine: 1.029 (ref 1.005–1.030)
pH: 5 (ref 5.0–8.0)

## 2018-03-15 LAB — COMPREHENSIVE METABOLIC PANEL
ALT: 1913 U/L — ABNORMAL HIGH (ref 0–44)
AST: 4149 U/L — AB (ref 15–41)
Albumin: 3.8 g/dL (ref 3.5–5.0)
Alkaline Phosphatase: 285 U/L — ABNORMAL HIGH (ref 38–126)
Anion gap: 17 — ABNORMAL HIGH (ref 5–15)
BUN: 35 mg/dL — ABNORMAL HIGH (ref 8–23)
CO2: 20 mmol/L — AB (ref 22–32)
Calcium: 9.9 mg/dL (ref 8.9–10.3)
Chloride: 99 mmol/L (ref 98–111)
Creatinine, Ser: 1.64 mg/dL — ABNORMAL HIGH (ref 0.44–1.00)
GFR calc Af Amer: 34 mL/min — ABNORMAL LOW (ref >60)
GFR calc non Af Amer: 29 mL/min — ABNORMAL LOW (ref >60)
Glucose, Bld: 325 mg/dL — ABNORMAL HIGH (ref 70–99)
Potassium: 3.4 mmol/L — ABNORMAL LOW (ref 3.5–5.1)
Sodium: 136 mmol/L (ref 135–145)
Total Bilirubin: 1.4 mg/dL — ABNORMAL HIGH (ref 0.3–1.2)
Total Protein: 7.1 g/dL (ref 6.5–8.1)

## 2018-03-15 LAB — BASIC METABOLIC PANEL
Anion gap: 17 — ABNORMAL HIGH (ref 5–15)
BUN: 44 mg/dL — AB (ref 8–23)
CO2: 16 mmol/L — ABNORMAL LOW (ref 22–32)
Calcium: 8.9 mg/dL (ref 8.9–10.3)
Chloride: 101 mmol/L (ref 98–111)
Creatinine, Ser: 2.2 mg/dL — ABNORMAL HIGH (ref 0.44–1.00)
GFR calc Af Amer: 24 mL/min — ABNORMAL LOW (ref >60)
GFR calc non Af Amer: 21 mL/min — ABNORMAL LOW (ref >60)
Glucose, Bld: 303 mg/dL — ABNORMAL HIGH (ref 70–99)
Potassium: 4.4 mmol/L (ref 3.5–5.1)
Sodium: 134 mmol/L — ABNORMAL LOW (ref 135–145)

## 2018-03-15 LAB — CBG MONITORING, ED
Glucose-Capillary: 275 mg/dL — ABNORMAL HIGH (ref 70–99)
Glucose-Capillary: 270 mg/dL — ABNORMAL HIGH (ref 70–99)

## 2018-03-15 LAB — PROTIME-INR
INR: 1.1 (ref 0.8–1.2)
Prothrombin Time: 14 seconds (ref 11.4–15.2)

## 2018-03-15 LAB — LACTIC ACID, PLASMA: Lactic Acid, Venous: 7.3 mmol/L (ref 0.5–1.9)

## 2018-03-15 LAB — TROPONIN I: Troponin I: <0.03 ng/mL (ref <0.03)

## 2018-03-15 LAB — LIPASE, BLOOD: Lipase: 69 U/L — ABNORMAL HIGH (ref 11–51)

## 2018-03-15 MED ORDER — IOHEXOL 300 MG/ML  SOLN
50.0000 mL | Freq: Once | INTRAMUSCULAR | Status: AC | PRN
  Administered 2018-03-15: 30 mL via INTRAVENOUS

## 2018-03-15 MED ORDER — SODIUM CHLORIDE 0.9 % IV BOLUS
500.0000 mL | Freq: Once | INTRAVENOUS | Status: AC
  Administered 2018-03-15: 500 mL via INTRAVENOUS

## 2018-03-15 MED ORDER — SODIUM CHLORIDE 0.9% FLUSH: 3.0000 mL | Freq: Once | INTRAVENOUS | Status: DC

## 2018-03-15 MED ORDER — DEXTROSE-NACL 5-0.45 % IV SOLN
INTRAVENOUS | Status: DC
  Administered 2018-03-16 – 2018-03-18 (×10): via INTRAVENOUS

## 2018-03-15 MED ORDER — ONDANSETRON HCL 4 MG/2ML IJ SOLN
4.0000 mg | Freq: Four times a day (QID) | INTRAMUSCULAR | Status: DC | PRN
  Administered 2018-03-16: 4 mg via INTRAVENOUS
  Filled 2018-03-15: qty 2

## 2018-03-15 MED ORDER — ONDANSETRON HCL 4 MG/2ML IJ SOLN
4.0000 mg | Freq: Once | INTRAMUSCULAR | Status: AC
  Administered 2018-03-15: 4 mg via INTRAVENOUS
  Filled 2018-03-15: qty 2

## 2018-03-15 MED ORDER — HYDROMORPHONE HCL 1 MG/ML IJ SOLN
1.0000 mg | Freq: Once | INTRAMUSCULAR | Status: AC
  Administered 2018-03-15: 1 mg via INTRAVENOUS
  Filled 2018-03-15: qty 1

## 2018-03-15 MED ORDER — ONDANSETRON HCL 4 MG PO TABS: 4.0000 mg | ORAL_TABLET | Freq: Four times a day (QID) | ORAL | Status: DC | PRN

## 2018-03-15 MED ORDER — MORPHINE SULFATE (PF) 2 MG/ML IV SOLN: 2.0000 mg | INTRAVENOUS | Status: DC | PRN

## 2018-03-15 MED ORDER — INSULIN GLARGINE 100 UNIT/ML ~~LOC~~ SOLN
20.0000 [IU] | Freq: Every day | SUBCUTANEOUS | Status: DC
  Administered 2018-03-15: 20 [IU] via SUBCUTANEOUS
  Filled 2018-03-15 (×2): qty 0.2

## 2018-03-15 MED ORDER — POTASSIUM CHLORIDE 10 MEQ/100ML IV SOLN
10.0000 meq | INTRAVENOUS | Status: AC
  Administered 2018-03-15 – 2018-03-16 (×2): 10 meq via INTRAVENOUS
  Filled 2018-03-15 (×2): qty 100

## 2018-03-15 MED ORDER — MORPHINE SULFATE (PF) 4 MG/ML IV SOLN
4.0000 mg | Freq: Once | INTRAVENOUS | Status: AC
  Administered 2018-03-15: 4 mg via INTRAVENOUS
  Filled 2018-03-15: qty 1

## 2018-03-15 MED ORDER — POTASSIUM CHLORIDE IN NACL 40-0.9 MEQ/L-% IV SOLN
INTRAVENOUS | Status: DC
  Filled 2018-03-15 (×6): qty 1000

## 2018-03-15 MED ORDER — INSULIN ASPART 100 UNIT/ML ~~LOC~~ SOLN: 0.0000 [IU] | Freq: Four times a day (QID) | SUBCUTANEOUS | Status: DC

## 2018-03-15 MED ORDER — SODIUM CHLORIDE 0.9 % IV SOLN
INTRAVENOUS | Status: DC
  Administered 2018-03-15: via INTRAVENOUS

## 2018-03-15 MED ORDER — PIPERACILLIN-TAZOBACTAM 3.375 G IVPB 30 MIN
3.3750 g | Freq: Once | INTRAVENOUS | Status: AC
  Administered 2018-03-15: 3.375 g via INTRAVENOUS
  Filled 2018-03-15: qty 50

## 2018-03-15 MED ORDER — FENTANYL CITRATE (PF) 100 MCG/2ML IJ SOLN
50.0000 ug | Freq: Once | INTRAMUSCULAR | Status: AC
  Administered 2018-03-15: 50 ug via INTRAVENOUS
  Filled 2018-03-15: qty 2

## 2018-03-15 MED ORDER — INSULIN REGULAR(HUMAN) IN NACL 100-0.9 UT/100ML-% IV SOLN
INTRAVENOUS | Status: DC
  Administered 2018-03-15: 2.1 [IU]/h via INTRAVENOUS
  Filled 2018-03-15: qty 100

## 2018-03-15 MED ORDER — FAMOTIDINE IN NACL 20-0.9 MG/50ML-% IV SOLN
20.0000 mg | Freq: Two times a day (BID) | INTRAVENOUS | Status: DC
  Administered 2018-03-15: 20 mg via INTRAVENOUS
  Filled 2018-03-15: qty 50

## 2018-03-15 MED ORDER — BISACODYL 10 MG RE SUPP: 10.0000 mg | Freq: Every day | RECTAL | Status: DC | PRN

## 2018-03-15 NOTE — ED Provider Notes (Addendum)
[facility] EMERGENCY DEPARTMENT Provider Note   CSN: [id number] Arrival date & time: 03/09/2018  1603    History   Chief Complaint Chief Complaint  Patient presents with  . Abdominal Pain    HPI Caitlin Yates is a 78 y.o. female.     The history is provided by the patient and a relative.  Abdominal Pain  Pain location:  LUQ Pain quality: cramping   Pain quality comment:  Burning Pain radiates to:  RUQ Pain severity:  Severe Onset quality:  Sudden Duration:  2 hours Timing:  Constant Progression:  Worsening Chronicity:  New Context: not diet changes, not eating, not recent illness and not suspicious food intake   Context comment:  Last ate breakfast 7 am Relieved by: drank gingerale which helped for a "little bit".  Worsened by:  Nothing Ineffective treatments:  None tried Associated symptoms: fever, nausea and vomiting   Associated symptoms: no chest pain, no constipation, no cough, no diarrhea, no dysuria, no hematuria, no shortness of breath and no sore throat     Past Medical History:  Diagnosis Date  . Arthritis   . Diabetes mellitus without complication ([place])   . GERD (gastroesophageal reflux disease)   . Hypercholesterolemia   . Hypertension     Patient Active Problem List   Diagnosis Date Noted  . Acute kidney injury ([place]) 09/17/2016  . Pancreatitis, acute 08/20/2012    Past Surgical History:  Procedure Laterality Date  . ABDOMINAL HYSTERECTOMY    . vascular stent right leg       OB History   No obstetric history on file.      Home Medications    Prior to Admission medications   Medication Sig Start Date End Date Taking? Authorizing Provider  amLODipine (NORVASC) 10 MG tablet TAKE 1 TABLET BY MOUTH ONCE DAILY. Patient taking differently: Take 10 mg by mouth every morning.  05/06/17  Yes Branch, Alphonse Guild, MD  aspirin EC 81 MG tablet Take 81 mg by mouth every morning.   Yes [provider]  atenolol-chlorthalidone (TENORETIC)  50-25 MG per tablet Take 1 tablet by mouth every morning.   Yes [provider]  atorvastatin (LIPITOR) 10 MG tablet Take 10 mg by mouth at bedtime.    Yes [provider]  clopidogrel (PLAVIX) 75 MG tablet Take 75 mg by mouth every morning.   Yes [provider]  HYDROcodone-acetaminophen (NORCO/VICODIN) 5-325 MG tablet Take 1 tablet by mouth every 6 (six) hours as needed for moderate pain.   Yes [provider]  predniSONE (DELTASONE) 20 MG tablet Take 20 mg by mouth daily with breakfast. 5 day course   Yes [provider]  cyclobenzaprine (FLEXERIL) 10 MG tablet Take 10 mg by mouth 3 (three) times daily as needed for muscle spasms.    [provider]  DEXILANT 60 MG capsule Take 60 mg by mouth daily.  07/16/14   [provider]  gabapentin (NEURONTIN) 300 MG capsule Take 300 mg by mouth 3 (three) times daily.    [provider]  glimepiride (AMARYL) 4 MG tablet Take 4 mg by mouth daily with breakfast.  02/04/15   [provider]  Multiple Vitamins-Minerals (CENTURY MATURE ADULT FORMULA PO) Take 1 tablet by mouth every morning.    [provider]  naproxen (NAPROSYN) 500 MG tablet Take 500 mg by mouth every 12 (twelve) hours as needed.  09/11/16   [provider]  potassium chloride (K-DUR) 10 MEQ tablet  Take 1 tablet (10 mEq total) by mouth daily. 12/28/16   Milton Ferguson, MD  Semaglutide,0.25 or 0.5MG /DOS, (OZEMPIC, 0.25 OR 0.5 MG/DOSE,) 2 MG/1.5ML SOPN Inject into the skin.    [provider]  sitaGLIPtan-metformin (JANUMET) 50-1000 MG per tablet Take 1 tablet by mouth 2 (two) times daily.    [provider]  spironolactone (ALDACTONE) 25 MG tablet Take 25 mg by mouth daily.    [provider]  spironolactone (ALDACTONE) 25 MG tablet TAKE (1/2) TABLET BY MOUTH ONCE DAILY. 09/04/17   Arnoldo Lenis, MD  Vitamin D, Ergocalciferol, (DRISDOL) 50000 units CAPS capsule Take  50,000 Units by mouth every Wednesday.    [provider]    Family History Family History  Problem Relation Age of Onset  . CVA Mother   . Hypertension Mother   . Heart attack Brother   . Colon cancer Neg Hx     Social History Social History   Tobacco Use  . Smoking status: Never Smoker  . Smokeless tobacco: Never Used  Substance Use Topics  . Alcohol use: No  . Drug use: No     Allergies   Lisinopril and Statins   Review of Systems Review of Systems  Constitutional: Positive for fever.       Subjective fever, states feels really hot when pain gets worse   HENT: Negative for congestion and sore throat.   Eyes: Negative.   Respiratory: Negative for cough, chest tightness and shortness of breath.   Cardiovascular: Negative for chest pain.  Gastrointestinal: Positive for abdominal pain, nausea and vomiting. Negative for constipation and diarrhea.  Genitourinary: Negative.  Negative for dysuria and hematuria.  Musculoskeletal: Negative for arthralgias, joint swelling and neck pain.  Skin: Negative.  Negative for rash and wound.  Neurological: Negative for headaches.  Psychiatric/Behavioral: Negative.      Physical Exam Updated Vital Signs BP (!) 94/54   Pulse (!) 109   Temp 97.9 F (36.6 C) (Oral)   Resp (!) 27   Ht 5\' 6"  (1.676 m)   Wt 76.2 kg   SpO2 96%   BMI 27.12 kg/m   Physical Exam Vitals signs and nursing note reviewed.  Constitutional:      Appearance: She is well-developed. She is ill-appearing. She is not diaphoretic.  HENT:     Head: Normocephalic and atraumatic.  Eyes:     Conjunctiva/sclera: Conjunctivae normal.  Neck:     Musculoskeletal: Normal range of motion.  Cardiovascular:     Rate and Rhythm: Normal rate and regular rhythm.     Heart sounds: Normal heart sounds.     Comments: Borderline tachycardic  Pulmonary:     Effort: Pulmonary effort is normal.     Breath sounds: Normal breath sounds. No wheezing.  Abdominal:      General: Bowel sounds are normal. There is distension.     Palpations: Abdomen is soft.     Tenderness: There is abdominal tenderness in the right upper quadrant and left upper quadrant. There is guarding.     Comments: Guarding RUQ.  No increased tympany although abd is distended.   Musculoskeletal: Normal range of motion.  Skin:    General: Skin is warm and dry.  Neurological:     Mental Status: She is alert.      ED Treatments / Results  Labs (all labs ordered are listed, but only abnormal results are displayed) Labs Reviewed  LIPASE, BLOOD - Abnormal; Notable for the following components:  Result Value   Lipase 69 (*)    All other components within normal limits  COMPREHENSIVE METABOLIC PANEL - Abnormal; Notable for the following components:   Potassium 3.4 (*)    CO2 20 (*)    Glucose, Bld 325 (*)    BUN 35 (*)    Creatinine, Ser 1.64 (*)    AST 4,149 (*)    ALT 1,913 (*)    Alkaline Phosphatase 285 (*)    Total Bilirubin 1.4 (*)    GFR calc non Af Amer 29 (*)    GFR calc Af Amer 34 (*)    Anion gap 17 (*)    All other components within normal limits  CBC WITH DIFFERENTIAL/PLATELET - Abnormal; Notable for the following components:   WBC 1.0 (*)    RBC 3.63 (*)    Hemoglobin 10.7 (*)    HCT 33.6 (*)    Platelets 404 (*)    Neutro Abs 0.7 (*)    Lymphs Abs 0.3 (*)    Monocytes Absolute 0.0 (*)    All other components within normal limits  TROPONIN I  URINALYSIS, ROUTINE W REFLEX MICROSCOPIC  PROTIME-INR    EKG None  Radiology Ct Abdomen Pelvis Wo Contrast  Result Date: 02/26/2018 CLINICAL DATA:  Left upper quadrant abdominal pain and nausea and vomiting that started today. EXAM: CT ABDOMEN AND PELVIS WITHOUT CONTRAST TECHNIQUE: Multidetector CT imaging of the abdomen and pelvis was performed following the standard protocol without IV contrast. COMPARISON:  CT scan and 08/21/2012 FINDINGS: Lower chest: Bibasilar scarring and atelectasis. No  infiltrates or effusions. Heart is borderline enlarged. Three-vessel coronary artery calcifications are noted. Hepatobiliary: No focal hepatic lesions are identified without contrast. The gallbladder is markedly distended and contains high attenuation material worrisome for hemorrhage. This measures approximately 68 Hounsfield units and I think sludge is un likely. Vicarious excretion of contrast material is also possible if the patient has had a recent contrast enhanced scan some rales. Right upper quadrant ultrasound examination may be helpful for further evaluation. No intra or extrahepatic biliary dilatation. Pancreas: No mass, inflammation or ductal dilatation. Spleen: Normal size.  No focal lesions. Adrenals/Urinary Tract: The bladder is grossly normal. Stomach/Bowel: The stomach, duodenum, small bowel and colon are grossly normal. No acute inflammatory process, mass lesions or obstructive findings. The appendix is not identified for certain and may be surgically absent. Vascular/Lymphatic: Advanced atherosclerotic calcifications involving the aorta and iliac arteries and femoral arteries. Left common iliac artery stent is noted. No mesenteric or retroperitoneal mass or adenopathy. Reproductive: Surgically absent. Other: No pelvic mass or adenopathy. No free pelvic fluid collections. No inguinal mass or adenopathy. No abdominal wall hernia or subcutaneous lesions. Musculoskeletal: No significant bony findings. IMPRESSION: 1. Distended gallbladder with high attenuation material highly suspicious for hemorrhagic cholecystitis. Recommend clinical correlation. Right upper quadrant ultrasound examination may be helpful for further evaluation. 2. Small bilateral adrenal gland adenomas. 3. Severe atherosclerotic calcifications involving the aorta and iliac arteries and branch vessels. There also three-vessel coronary artery calcifications. Electronically Signed   By: Marijo Sanes M.D.   On: 03/09/2018 20:22   Dg  Abd Acute W/chest  Result Date: 03/11/2018 CLINICAL DATA:  Left upper quadrant pain, distention, and nausea and vomiting beginning today. Fever. EXAM: DG ABDOMEN ACUTE W/ 1V CHEST COMPARISON:  None. FINDINGS: There is no evidence of dilated bowel loops or free intraperitoneal air. No radiopaque calculi or other significant radiographic abnormality is seen. Peripheral vascular calcification noted. Low lung volumes are  noted, with mild bibasilar atelectasis. No evidence of pulmonary consolidation or edema. No evidence of pleural effusion. Heart size is normal. IMPRESSION: 1. Unremarkable bowel gas pattern. 2. Low lung volumes with mild bibasilar atelectasis. Electronically Signed   By: Earle Gell M.D.   On: 03/07/2018 18:10    Procedures Procedures (including critical care time)  Medications Ordered in ED Medications  sodium chloride flush (NS) 0.9 % injection 3 mL (3 mLs Intravenous Not Given 02/19/2018 1721)  piperacillin-tazobactam (ZOSYN) IVPB 3.375 g (3.375 g Intravenous New Bag/Given 02/26/2018 2031)  ondansetron (ZOFRAN) injection 4 mg (4 mg Intravenous Given 03/10/2018 1711)  morphine 4 MG/ML injection 4 mg (4 mg Intravenous Given 02/24/2018 1711)  sodium chloride 0.9 % bolus 500 mL (0 mLs Intravenous Stopped 03/12/2018 2005)  fentaNYL (SUBLIMAZE) injection 50 mcg (50 mcg Intravenous Given 02/25/2018 1847)  HYDROmorphone (DILAUDID) injection 1 mg (1 mg Intravenous Given 02/15/2018 1911)  sodium chloride 0.9 % bolus 500 mL (0 mLs Intravenous Stopped 03/09/2018 1922)  iohexol (OMNIPAQUE) 300 MG/ML solution 50 mL (30 mLs Intravenous Contrast Given 02/22/2018 1938)  sodium chloride 0.9 % bolus 500 mL (500 mLs Intravenous New Bag/Given 03/04/2018 2045)     Initial Impression / Assessment and Plan / ED Course  I have reviewed the triage vital signs and the nursing notes.  Pertinent labs & imaging results that were available during my care of the patient were reviewed by me and considered in my medical decision making  (see chart for details).        Patient with acute cholecystitis.  Discussed with Dr. Arnoldo Morale who will consult patient and plan surgical intervention but request hospitalist admission for ongoing care until LFTs are improving.  He recommends an ultrasound in the morning, IV Zosyn which has already been started.  He was also okay with clear liquids at this time.  Patient may need interventional radiology for needle decompression if symptoms worsen prior to improvement in LFTs, but will reassess in the morning.  Call was placed to hospitalist for discussion of admission.  Discussed with Dr. Nehemiah Settle who agrees with medical admission.  Final Clinical Impressions(s) / ED Diagnoses   Final diagnoses:  Acute cholecystitis  Transaminitis  Leukopenia, unspecified type  Chronic anemia    ED Discharge Orders    None       Landis Martins 02/23/2018 2115    Julianne Rice, MD 03/16/18 1611   CRITICAL CARE Performed by: Evalee Jefferson Total critical care time: 45 minutes Critical care time was exclusive of separately billable procedures and treating other patients. Critical care was necessary to treat or prevent imminent or life-threatening deterioration. Critical care was time spent personally by me on the following activities: development of treatment plan with patient and/or surrogate as well as nursing, discussions with consultants, evaluation of patient's response to treatment, examination of patient, obtaining history from patient or surrogate, ordering and performing treatments and interventions, ordering and review of laboratory studies, ordering and review of radiographic studies, pulse oximetry and re-evaluation of patient's condition.     Evalee Jefferson, PA-C 04/01/18 1313    Julianne Rice, MD 04/04/18 1730

## 2018-03-15 NOTE — H&P (Addendum)
History and Physical  Caitlin Yates RFF:[id number] DOB: [birth year] DOA: 03/05/2018  Referring physician: Evalee Jefferson, PA-C, ED PA PCP: Renee Rival, NP  Outpatient Specialists:   Patient Coming From: home  Chief Complaint: acute abdominal pain  HPI: Caitlin Yates is a 78 y.o. female with a history of diabetes, hypertension, GERD, hyperlipidemia.  Patient seen in the emergency department due to acute onset of abdominal pain that started around 2 PM.  Prior to this moment, the patient states that she was feeling fine.  Pain is rated as severe, cramping and burning right upper quadrant greater than left upper quadrant.  Her symptoms are worsening.  She complains of fever, chills nausea, vomiting.  No palliating or provoking factors, although her pain did improve with IV narcotics.  She was recently placed on prednisone and Vicodin for her arm pain.  She reports no other recent viral illnesses, new medications or medication changes.  Emergency Department Course: White count 1.0 with an ANC of 0.7.  UA suggestive of UTI.  AST is elevated to 4100 and ALT 2000.  Alk phos and bilirubin L also elevated.  Bicarb is 20, anion gap 17, creatinine 1.64.  Review of Systems:   Pt denies any diarrhea, constipation, abdominal pain, shortness of breath, dyspnea on exertion, orthopnea, cough, wheezing, palpitations, headache, vision changes, lightheadedness, dizziness, melena, rectal bleeding.  Review of systems are otherwise negative  Past Medical History:  Diagnosis Date  . Arthritis   . Diabetes mellitus without complication ([place])   . GERD (gastroesophageal reflux disease)   . Hypercholesterolemia   . Hypertension    Past Surgical History:  Procedure Laterality Date  . ABDOMINAL HYSTERECTOMY    . vascular stent right leg     Social History:  reports that she has never smoked. She has never used smokeless tobacco. She reports that she does not drink alcohol or use drugs. Patient lives at  home  Allergies  Allergen Reactions  . Lisinopril Swelling    Tongue swelling  . Statins     Muscle aches    Family History  Problem Relation Age of Onset  . CVA Mother   . Hypertension Mother   . Heart attack Brother   . Colon cancer Neg Hx       Prior to Admission medications   Medication Sig Start Date End Date Taking? Authorizing Provider  amLODipine (NORVASC) 10 MG tablet TAKE 1 TABLET BY MOUTH ONCE DAILY. Patient taking differently: Take 10 mg by mouth every morning.  05/06/17  Yes Branch, Alphonse Guild, MD  aspirin EC 81 MG tablet Take 81 mg by mouth every morning.   Yes [provider]  atenolol-chlorthalidone (TENORETIC) 50-25 MG per tablet Take 1 tablet by mouth every morning.   Yes [provider]  atorvastatin (LIPITOR) 10 MG tablet Take 10 mg by mouth at bedtime.    Yes [provider]  clopidogrel (PLAVIX) 75 MG tablet Take 75 mg by mouth every morning.   Yes [provider]  HYDROcodone-acetaminophen (NORCO/VICODIN) 5-325 MG tablet Take 1 tablet by mouth every 6 (six) hours as needed for moderate pain.   Yes [provider]  predniSONE (DELTASONE) 20 MG tablet Take 20 mg by mouth daily with breakfast. 5 day course   Yes [provider]  cyclobenzaprine (FLEXERIL) 10 MG tablet Take 10 mg by mouth 3 (three) times daily as needed for muscle spasms.    [provider]  DEXILANT 60 MG capsule Take 60 mg  by mouth daily.  07/16/14   [provider]  gabapentin (NEURONTIN) 300 MG capsule Take 300 mg by mouth 3 (three) times daily.    [provider]  glimepiride (AMARYL) 4 MG tablet Take 4 mg by mouth daily with breakfast.  02/04/15   [provider]  Multiple Vitamins-Minerals (CENTURY MATURE ADULT FORMULA PO) Take 1 tablet by mouth every morning.    [provider]  naproxen (NAPROSYN) 500 MG tablet Take 500 mg by mouth every 12 (twelve) hours as needed.  09/11/16   [provider]  potassium chloride (K-DUR) 10 MEQ tablet Take 1 tablet (10 mEq total) by mouth daily. 12/28/16   Milton Ferguson, MD  Semaglutide,0.25 or 0.'5MG'$ /DOS, (OZEMPIC, 0.25 OR 0.5 MG/DOSE,) 2 MG/1.5ML SOPN Inject into the skin.    [provider]  sitaGLIPtan-metformin (JANUMET) 50-1000 MG per tablet Take 1 tablet by mouth 2 (two) times daily.    [provider]  spironolactone (ALDACTONE) 25 MG tablet Take 25 mg by mouth daily.    [provider]  spironolactone (ALDACTONE) 25 MG tablet TAKE (1/2) TABLET BY MOUTH ONCE DAILY. 09/04/17   Arnoldo Lenis, MD  Vitamin D, Ergocalciferol, (DRISDOL) 50000 units CAPS capsule Take 50,000 Units by mouth every Wednesday.    [provider]    Physical Exam: BP (!) 94/54   Pulse (!) 109   Temp 97.9 F (36.6 C) (Oral)   Resp (!) 27   Ht '5\' 6"'$  (1.676 m)   Wt 76.2 kg   SpO2 96%   BMI 27.12 kg/m   . General: Elderly black female. Awake and alert and oriented x3. No acute cardiopulmonary distress.  Marland Kitchen HEENT: Normocephalic atraumatic.  Right and left ears normal in appearance.  Pupils equal, round, reactive to light. Extraocular muscles are intact. Sclerae anicteric and noninjected.  Moist mucosal membranes. No mucosal lesions.  . Neck: Neck supple without lymphadenopathy. No carotid bruits. No masses palpated.  . Cardiovascular: Regular rate with normal S1-S2 sounds. No murmurs, rubs, gallops auscultated. No JVD.  Marland Kitchen Respiratory: Good respiratory effort with no wheezes, rales, rhonchi. Lungs clear to auscultation bilaterally.  No accessory muscle use. . Abdomen: Soft, tender in the right upper quadrant greater than the left.  No rebound or guarding present currently, although patient did receive morphine prior to my arrival., nondistended. Active bowel sounds. No masses or hepatosplenomegaly  . Skin: No rashes, lesions, or ulcerations.  Dry, warm to touch. 2+ dorsalis pedis and radial pulses. . Musculoskeletal:  No calf or leg pain. All major joints not erythematous nontender.  No upper or lower joint deformation.  Good ROM.  No contractures  . Psychiatric: Intact judgment and insight. Pleasant and cooperative. . Neurologic: No focal neurological deficits. Strength is 5/5 and symmetric in upper and lower extremities.  Cranial nerves II through XII are grossly intact.           Labs on Admission: I have personally reviewed following labs and imaging studies  CBC: Recent Labs  Lab 03/03/2018 1704  WBC 1.0*  NEUTROABS 0.7*  HGB 10.7*  HCT 33.6*  MCV 92.6  PLT 295*   Basic Metabolic Panel: Recent Labs  Lab 03/10/2018 1704  NA 136  K 3.4*  CL 99  CO2 20*  GLUCOSE 325*  BUN 35*  CREATININE 1.64*  CALCIUM 9.9   GFR: Estimated Creatinine Clearance: 29 mL/min (A) (by C-G formula based on SCr of 1.64 mg/dL (H)). Liver Function Tests: Recent Labs  Lab  03/10/2018 1704  AST 4,149*  ALT 1,913*  ALKPHOS 285*  BILITOT 1.4*  PROT 7.1  ALBUMIN 3.8   Recent Labs  Lab 02/23/2018 1704  LIPASE 69*   No results for input(s): AMMONIA in the last 168 hours. Coagulation Profile: Recent Labs  Lab 02/25/2018 1704  INR 1.1   Cardiac Enzymes: Recent Labs  Lab 03/04/2018 1704  TROPONINI <0.03   BNP (last 3 results) No results for input(s): PROBNP in the last 8760 hours. HbA1C: No results for input(s): HGBA1C in the last 72 hours. CBG: No results for input(s): GLUCAP in the last 168 hours. Lipid Profile: No results for input(s): CHOL, HDL, LDLCALC, TRIG, CHOLHDL, LDLDIRECT in the last 72 hours. Thyroid Function Tests: No results for input(s): TSH, T4TOTAL, FREET4, T3FREE, THYROIDAB in the last 72 hours. Anemia Panel: No results for input(s): VITAMINB12, FOLATE, FERRITIN, TIBC, IRON, RETICCTPCT in the last 72 hours. Urine analysis:    Component Value Date/Time   COLORURINE AMBER (A) 03/04/2018 2046   APPEARANCEUR TURBID (A) 03/14/2018 2046   LABSPEC 1.029 02/21/2018 2046   PHURINE 5.0  03/05/2018 2046   GLUCOSEU 50 (A) 02/26/2018 2046   HGBUR SMALL (A) 03/02/2018 2046   BILIRUBINUR NEGATIVE 03/10/2018 2046   KETONESUR 5 (A) 03/07/2018 2046   PROTEINUR 100 (A) 02/24/2018 2046   UROBILINOGEN 0.2 08/18/2012 1835   NITRITE NEGATIVE 03/01/2018 2046   LEUKOCYTESUR TRACE (A) 03/01/2018 2046   Sepsis Labs: '@LABRCNTIP'$ (procalcitonin:4,lacticidven:4) )No results found for this or any previous visit (from the past 240 hour(s)).   Radiological Exams on Admission: Ct Abdomen Pelvis Wo Contrast  Result Date: 02/20/2018 CLINICAL DATA:  Left upper quadrant abdominal pain and nausea and vomiting that started today. EXAM: CT ABDOMEN AND PELVIS WITHOUT CONTRAST TECHNIQUE: Multidetector CT imaging of the abdomen and pelvis was performed following the standard protocol without IV contrast. COMPARISON:  CT scan and 08/21/2012 FINDINGS: Lower chest: Bibasilar scarring and atelectasis. No infiltrates or effusions. Heart is borderline enlarged. Three-vessel coronary artery calcifications are noted. Hepatobiliary: No focal hepatic lesions are identified without contrast. The gallbladder is markedly distended and contains high attenuation material worrisome for hemorrhage. This measures approximately 68 Hounsfield units and I think sludge is un likely. Vicarious excretion of contrast material is also possible if the patient has had a recent contrast enhanced scan some rales. Right upper quadrant ultrasound examination may be helpful for further evaluation. No intra or extrahepatic biliary dilatation. Pancreas: No mass, inflammation or ductal dilatation. Spleen: Normal size.  No focal lesions. Adrenals/Urinary Tract: The bladder is grossly normal. Stomach/Bowel: The stomach, duodenum, small bowel and colon are grossly normal. No acute inflammatory process, mass lesions or obstructive findings. The appendix is not identified for certain and may be surgically absent. Vascular/Lymphatic: Advanced atherosclerotic  calcifications involving the aorta and iliac arteries and femoral arteries. Left common iliac artery stent is noted. No mesenteric or retroperitoneal mass or adenopathy. Reproductive: Surgically absent. Other: No pelvic mass or adenopathy. No free pelvic fluid collections. No inguinal mass or adenopathy. No abdominal wall hernia or subcutaneous lesions. Musculoskeletal: No significant bony findings. IMPRESSION: 1. Distended gallbladder with high attenuation material highly suspicious for hemorrhagic cholecystitis. Recommend clinical correlation. Right upper quadrant ultrasound examination may be helpful for further evaluation. 2. Small bilateral adrenal gland adenomas. 3. Severe atherosclerotic calcifications involving the aorta and iliac arteries and branch vessels. There also three-vessel coronary artery calcifications. Electronically Signed   By: Marijo Sanes M.D.   On: 03/04/2018 20:22   Dg Abd Acute  W/chest  Result Date: 02/28/2018 CLINICAL DATA:  Left upper quadrant pain, distention, and nausea and vomiting beginning today. Fever. EXAM: DG ABDOMEN ACUTE W/ 1V CHEST COMPARISON:  None. FINDINGS: There is no evidence of dilated bowel loops or free intraperitoneal air. No radiopaque calculi or other significant radiographic abnormality is seen. Peripheral vascular calcification noted. Low lung volumes are noted, with mild bibasilar atelectasis. No evidence of pulmonary consolidation or edema. No evidence of pleural effusion. Heart size is normal. IMPRESSION: 1. Unremarkable bowel gas pattern. 2. Low lung volumes with mild bibasilar atelectasis. Electronically Signed   By: Earle Gell M.D.   On: 03/08/2018 18:10    EKG: Independently reviewed.  Sinus tachycardia.  PVCs present.  By voltage criteria.  No acute ST changes.  Assessment/Plan: Principal Problem:   Acute cholecystitis Active Problems:   Diabetes mellitus without complication (HCC)   GERD (gastroesophageal reflux disease)   Hypertension    Metabolic acidosis   Neutropenia (HCC)   Acute lower UTI   Hypokalemia    This patient was discussed with the ED physician, including pertinent vitals, physical exam findings, labs, and imaging.  We also discussed care given by the ED provider.  1. Acute cholecystitis a. General surgery already consulted b. Admit to stepdown c. LFTs extremely elevated -I question whether there is a secondary process involved in her elevated LFTs. d. Hold patient n.p.o. except for sips and ice chips e. Continue Zosyn f. Repeat CMP in the morning 2. UTI a. Obtain urine cultures b. Zosyn should cover 3. Metabolic acidosis a. Check lactic acid b. Patient does have minimal ketonuria.  Question whether her metabolic acidosis represents early DKA versus infection c. Repeat BMP and stat CBG - if acidosis not improved with fluid bolus, may need to start insulin drip, although will start treatment with dose of lantus.  4. Neutropenia a. Uncertain of the patient's etiology for her neutropenia. b. She is on plavix, which can cause neutropenia. Will hold this for now. c. Check acute hepatitis panel with HIV d. Repeat CBC in the morning e. May need bone marrow bx if remains neutropenic with no other source. 5. Diabetes a. Hold oral hypoglycemics b. Lantus 20 units c. Sliding scale insulin every 6 hours 6. GERD a. Pepcid IV 7. Hypertension a. Hold antihypertensives 8. Hypokalemia a. IV fluids with potassium replacement b. We will follow potassium  DVT prophylaxis: SCDs as patient pending surgery Consultants: General surgery Code Status: Full code Family Communication: 2 daughters present Disposition Plan: Patient should be able to return home following improvement   Truett Mainland, DO

## 2018-03-15 NOTE — ED Notes (Signed)
Gina called for Lantus and continuous infusion NaCl and KCl 40 meq. Marland Kitchen

## 2018-03-15 NOTE — ED Triage Notes (Signed)
Patient c/o left upper abd pain with nausea and vomiting that started today at 2:30pm. Denies any diarrhea. Per patient felt like she had a fever.

## 2018-03-15 NOTE — ED Notes (Signed)
Date and time results received: 03/07/2018 5:59 PM  Test: WBC Critical Value: 1.0   Name of Provider Notified: Evalee Jefferson  Orders Received? Or Actions Taken?:see orders

## 2018-03-15 NOTE — Progress Notes (Signed)
Patient ID: Caitlin Yates, female   DOB: 1940/07/08, 78 y.o.   MRN: [id number]  Lactic Acid 7.  BMP still shows Gapped metabolic acidosis with worsening creatinine.   Will start insulin drip. BMPs q4 hours. Fluid bolus x2L. Rpt lactic acid in 3 hours. CBGs hourly.  Truett Mainland, DO 03/03/2018 11:12 PM

## 2018-03-15 NOTE — ED Notes (Signed)
Patient transported to CT 

## 2018-03-15 NOTE — ED Notes (Signed)
Patient unable to provide urine sample at this time

## 2018-03-15 NOTE — ED Notes (Signed)
CRITICAL VALUE ALERT  Critical Value:  Lactic Acid 7.3  Date & Time Notied:  03/09/2018 2255  Provider Notified: Dr. Joyice Faster via Amion  Orders Received/Actions taken: n/a

## 2018-03-16 ENCOUNTER — Inpatient Hospital Stay (HOSPITAL_COMMUNITY): Payer: Medicare Other

## 2018-03-16 ENCOUNTER — Encounter (HOSPITAL_COMMUNITY): Payer: Self-pay | Admitting: Interventional Radiology

## 2018-03-16 DIAGNOSIS — R6521 Severe sepsis with septic shock: Secondary | ICD-10-CM

## 2018-03-16 DIAGNOSIS — A419 Sepsis, unspecified organism: Principal | ICD-10-CM

## 2018-03-16 DIAGNOSIS — R579 Shock, unspecified: Secondary | ICD-10-CM

## 2018-03-16 HISTORY — PX: IR PERC CHOLECYSTOSTOMY: IMG2326

## 2018-03-16 LAB — BLOOD GAS, ARTERIAL
ACID-BASE DEFICIT: 10.8 mmol/L — AB (ref 0.0–2.0)
Acid-base deficit: 24.3 mmol/L — ABNORMAL HIGH (ref 0.0–2.0)
Acid-base deficit: 24.1 mmol/L — ABNORMAL HIGH (ref 0.0–2.0)
BICARBONATE: 5.5 mmol/L — AB (ref 20.0–28.0)
Bicarbonate: 16 mmol/L — ABNORMAL LOW (ref 20.0–28.0)
Bicarbonate: 5.4 mmol/L — ABNORMAL LOW (ref 20.0–28.0)
Drawn by: 519031
Drawn by: 519031
FIO2: 100
FIO2: 100
FIO2: 100
MECHVT: 550 mL
MECHVT: 550 mL
O2 Saturation: 97 %
O2 Saturation: 95.2 %
O2 Saturation: 91.2 %
PEEP: 8 cmH2O
PEEP: 8 cmH2O
Patient temperature: 34.4
Patient temperature: 98.6
Patient temperature: 98.6
RATE: 28 resp/min
RATE: 28 resp/min
pCO2 arterial: 25.6 mmHg — ABNORMAL LOW (ref 32.0–48.0)
pCO2 arterial: 28 mmHg — ABNORMAL LOW (ref 32.0–48.0)
pCO2 arterial: 25.4 mmHg — ABNORMAL LOW (ref 32.0–48.0)
pH, Arterial: 7.35 (ref 7.350–7.450)
pH, Arterial: 6.925 — CL (ref 7.350–7.450)
pH, Arterial: 6.957 — CL (ref 7.350–7.450)
pO2, Arterial: 103 mmHg (ref 83.0–108.0)
pO2, Arterial: 140 mmHg — ABNORMAL HIGH (ref 83.0–108.0)
pO2, Arterial: 100 mmHg (ref 83.0–108.0)

## 2018-03-16 LAB — CBC
HCT: 27.6 % — ABNORMAL LOW (ref 36.0–46.0)
HEMATOCRIT: 28.7 % — AB (ref 36.0–46.0)
HEMATOCRIT: 28 % — AB (ref 36.0–46.0)
HEMOGLOBIN: 9.1 g/dL — AB (ref 12.0–15.0)
HEMOGLOBIN: 8.8 g/dL — AB (ref 12.0–15.0)
Hemoglobin: 8.5 g/dL — ABNORMAL LOW (ref 12.0–15.0)
MCH: 29.4 pg (ref 26.0–34.0)
MCH: 30 pg (ref 26.0–34.0)
MCH: 29.7 pg (ref 26.0–34.0)
MCHC: 30.8 g/dL (ref 30.0–36.0)
MCHC: 31.7 g/dL (ref 30.0–36.0)
MCHC: 31.4 g/dL (ref 30.0–36.0)
MCV: 95.5 fL (ref 80.0–100.0)
MCV: 94.7 fL (ref 80.0–100.0)
MCV: 94.6 fL (ref 80.0–100.0)
Platelets: 313 10*3/uL (ref 150–400)
Platelets: 343 10*3/uL (ref 150–400)
Platelets: 287 10*3/uL (ref 150–400)
RBC: 2.89 MIL/uL — ABNORMAL LOW (ref 3.87–5.11)
RBC: 3.03 MIL/uL — ABNORMAL LOW (ref 3.87–5.11)
RBC: 2.96 MIL/uL — ABNORMAL LOW (ref 3.87–5.11)
RDW: 13.5 % (ref 11.5–15.5)
RDW: 13.6 % (ref 11.5–15.5)
RDW: 13.7 % (ref 11.5–15.5)
WBC: 4.7 10*3/uL (ref 4.0–10.5)
WBC: 7.6 10*3/uL (ref 4.0–10.5)
WBC: 10.2 10*3/uL (ref 4.0–10.5)
nRBC: 1.5 % — ABNORMAL HIGH (ref 0.0–0.2)
nRBC: 1.3 % — ABNORMAL HIGH (ref 0.0–0.2)
nRBC: 1.2 % — ABNORMAL HIGH (ref 0.0–0.2)

## 2018-03-16 LAB — LACTIC ACID, PLASMA
Lactic Acid, Venous: 5.8 mmol/L (ref 0.5–1.9)
Lactic Acid, Venous: 6.4 mmol/L (ref 0.5–1.9)
Lactic Acid, Venous: 7.3 mmol/L (ref 0.5–1.9)
Lactic Acid, Venous: 7.4 mmol/L (ref 0.5–1.9)
Lactic Acid, Venous: >11 mmol/L (ref 0.5–1.9)
Lactic Acid, Venous: >11 mmol/L (ref 0.5–1.9)

## 2018-03-16 LAB — BASIC METABOLIC PANEL
Anion gap: 15 (ref 5–15)
Anion gap: 14 (ref 5–15)
BUN: 51 mg/dL — ABNORMAL HIGH (ref 8–23)
BUN: 48 mg/dL — ABNORMAL HIGH (ref 8–23)
CO2: 16 mmol/L — ABNORMAL LOW (ref 22–32)
CO2: 12 mmol/L — ABNORMAL LOW (ref 22–32)
CREATININE: 2.93 mg/dL — AB (ref 0.44–1.00)
Calcium: 8.2 mg/dL — ABNORMAL LOW (ref 8.9–10.3)
Calcium: 7.3 mg/dL — ABNORMAL LOW (ref 8.9–10.3)
Chloride: 105 mmol/L (ref 98–111)
Chloride: 109 mmol/L (ref 98–111)
Creatinine, Ser: 3.58 mg/dL — ABNORMAL HIGH (ref 0.44–1.00)
GFR calc Af Amer: 17 mL/min — ABNORMAL LOW (ref >60)
GFR calc Af Amer: 13 mL/min — ABNORMAL LOW (ref >60)
GFR calc non Af Amer: 15 mL/min — ABNORMAL LOW (ref >60)
GFR calc non Af Amer: 11 mL/min — ABNORMAL LOW (ref >60)
Glucose, Bld: 194 mg/dL — ABNORMAL HIGH (ref 70–99)
Glucose, Bld: 72 mg/dL (ref 70–99)
Potassium: 4.2 mmol/L (ref 3.5–5.1)
Potassium: 4.5 mmol/L (ref 3.5–5.1)
Sodium: 136 mmol/L (ref 135–145)
Sodium: 135 mmol/L (ref 135–145)

## 2018-03-16 LAB — GLUCOSE, CAPILLARY
Glucose-Capillary: 147 mg/dL — ABNORMAL HIGH (ref 70–99)
Glucose-Capillary: 96 mg/dL (ref 70–99)
Glucose-Capillary: 70 mg/dL (ref 70–99)
Glucose-Capillary: 91 mg/dL (ref 70–99)
Glucose-Capillary: 72 mg/dL (ref 70–99)
Glucose-Capillary: 72 mg/dL (ref 70–99)
Glucose-Capillary: 60 mg/dL — ABNORMAL LOW (ref 70–99)
Glucose-Capillary: 52 mg/dL — ABNORMAL LOW (ref 70–99)
Glucose-Capillary: 83 mg/dL (ref 70–99)
Glucose-Capillary: 84 mg/dL (ref 70–99)

## 2018-03-16 LAB — DIC (DISSEMINATED INTRAVASCULAR COAGULATION) PANEL: FIBRINOGEN: 354 mg/dL (ref 210–475)

## 2018-03-16 LAB — COMPREHENSIVE METABOLIC PANEL
ALK PHOS: 237 U/L — AB (ref 38–126)
ALT: 3038 U/L — ABNORMAL HIGH (ref 0–44)
ANION GAP: 13 (ref 5–15)
AST: 8024 U/L — ABNORMAL HIGH (ref 15–41)
Albumin: 2.6 g/dL — ABNORMAL LOW (ref 3.5–5.0)
BILIRUBIN TOTAL: 1.7 mg/dL — AB (ref 0.3–1.2)
BUN: 42 mg/dL — ABNORMAL HIGH (ref 8–23)
CO2: 16 mmol/L — ABNORMAL LOW (ref 22–32)
Calcium: 7.7 mg/dL — ABNORMAL LOW (ref 8.9–10.3)
Chloride: 109 mmol/L (ref 98–111)
Creatinine, Ser: 3.15 mg/dL — ABNORMAL HIGH (ref 0.44–1.00)
GFR calc Af Amer: 15 mL/min — ABNORMAL LOW (ref >60)
GFR calc non Af Amer: 13 mL/min — ABNORMAL LOW (ref >60)
Glucose, Bld: 148 mg/dL — ABNORMAL HIGH (ref 70–99)
POTASSIUM: 3.7 mmol/L (ref 3.5–5.1)
Sodium: 138 mmol/L (ref 135–145)
Total Protein: 5.4 g/dL — ABNORMAL LOW (ref 6.5–8.1)

## 2018-03-16 LAB — CBG MONITORING, ED
GLUCOSE-CAPILLARY: 213 mg/dL — AB (ref 70–99)
Glucose-Capillary: 169 mg/dL — ABNORMAL HIGH (ref 70–99)
Glucose-Capillary: 185 mg/dL — ABNORMAL HIGH (ref 70–99)
Glucose-Capillary: 212 mg/dL — ABNORMAL HIGH (ref 70–99)

## 2018-03-16 LAB — MRSA PCR SCREENING: MRSA BY PCR: NEGATIVE

## 2018-03-16 LAB — DIC (DISSEMINATED INTRAVASCULAR COAGULATION)PANEL
D-Dimer, Quant: >20 ug/mL-FEU — ABNORMAL HIGH (ref 0.00–0.50)
INR: 1.5 — ABNORMAL HIGH (ref 0.8–1.2)
Platelets: 322 10*3/uL (ref 150–400)
Prothrombin Time: 17.6 seconds — ABNORMAL HIGH (ref 11.4–15.2)
Smear Review: NONE SEEN
aPTT: 35 seconds (ref 24–36)

## 2018-03-16 LAB — ABO/RH: ABO/RH(D): O POS

## 2018-03-16 LAB — HEMOGLOBIN A1C
Hgb A1c MFr Bld: 8.1 % — ABNORMAL HIGH (ref 4.8–5.6)
Mean Plasma Glucose: 185.77 mg/dL

## 2018-03-16 LAB — TYPE AND SCREEN
ABO/RH(D): O POS
Antibody Screen: NEGATIVE

## 2018-03-16 LAB — COOXEMETRY PANEL
CARBOXYHEMOGLOBIN: 0.7 % (ref 0.5–1.5)
Methemoglobin: 1.1 % (ref 0.0–1.5)
O2 Saturation: 79.9 %
Total hemoglobin: 8.1 g/dL — ABNORMAL LOW (ref 12.0–16.0)

## 2018-03-16 MED ORDER — STERILE WATER FOR INJECTION IV SOLN
INTRAVENOUS | Status: DC
  Administered 2018-03-16 – 2018-03-18 (×9): via INTRAVENOUS_CENTRAL
  Filled 2018-03-16: qty 50
  Filled 2018-03-16 (×9): qty 150

## 2018-03-16 MED ORDER — PRISMASOL BGK 4/2.5 32-4-2.5 MEQ/L IV SOLN
INTRAVENOUS | Status: DC
  Administered 2018-03-16 – 2018-03-18 (×17): via INTRAVENOUS_CENTRAL
  Filled 2018-03-16 (×22): qty 5000

## 2018-03-16 MED ORDER — SODIUM CHLORIDE 0.9 % IV SOLN
INTRAVENOUS | Status: DC | PRN
  Administered 2018-03-16: 03:00:00 via INTRAVENOUS

## 2018-03-16 MED ORDER — PIPERACILLIN-TAZOBACTAM 3.375 G IVPB 30 MIN
3.3750 g | Freq: Four times a day (QID) | INTRAVENOUS | Status: DC
  Administered 2018-03-17 (×2): 3.375 g via INTRAVENOUS
  Filled 2018-03-16 (×2): qty 50

## 2018-03-16 MED ORDER — FENTANYL CITRATE (PF) 100 MCG/2ML IJ SOLN
50.0000 ug | Freq: Once | INTRAMUSCULAR | Status: AC
  Administered 2018-03-16: 50 ug via INTRAVENOUS

## 2018-03-16 MED ORDER — PIPERACILLIN-TAZOBACTAM 3.375 G IVPB 30 MIN
3.3750 g | Freq: Four times a day (QID) | INTRAVENOUS | Status: DC
  Filled 2018-03-16 (×2): qty 50

## 2018-03-16 MED ORDER — DEXTROSE 50 % IV SOLN
25.0000 mL | Freq: Once | INTRAVENOUS | Status: AC
  Administered 2018-03-16: 25 mL via INTRAVENOUS

## 2018-03-16 MED ORDER — SODIUM BICARBONATE 8.4 % IV SOLN
50.0000 meq | Freq: Once | INTRAVENOUS | Status: AC
  Administered 2018-03-16: 50 meq via INTRAVENOUS

## 2018-03-16 MED ORDER — SODIUM BICARBONATE 8.4 % IV SOLN
100.0000 meq | Freq: Once | INTRAVENOUS | Status: AC
  Administered 2018-03-16: 100 meq via INTRAVENOUS

## 2018-03-16 MED ORDER — SODIUM CHLORIDE 0.9 % IV SOLN
1.0000 g | INTRAVENOUS | Status: DC
  Administered 2018-03-16: 1 g via INTRAVENOUS
  Filled 2018-03-16: qty 10

## 2018-03-16 MED ORDER — LIDOCAINE HCL 1 % IJ SOLN
INTRAMUSCULAR | Status: AC | PRN
  Administered 2018-03-16: 10 mL

## 2018-03-16 MED ORDER — SENNOSIDES-DOCUSATE SODIUM 8.6-50 MG PO TABS: 2.0000 | ORAL_TABLET | Freq: Two times a day (BID) | ORAL | Status: DC | PRN

## 2018-03-16 MED ORDER — HEPARIN SODIUM (PORCINE) 1000 UNIT/ML DIALYSIS
1000.0000 [IU] | INTRAMUSCULAR | Status: DC | PRN
  Filled 2018-03-16: qty 6

## 2018-03-16 MED ORDER — FENTANYL CITRATE (PF) 100 MCG/2ML IJ SOLN
INTRAMUSCULAR | Status: AC
  Filled 2018-03-16: qty 2

## 2018-03-16 MED ORDER — SODIUM BICARBONATE 8.4 % IV SOLN
INTRAVENOUS | Status: AC
  Filled 2018-03-16: qty 100

## 2018-03-16 MED ORDER — STERILE WATER FOR INJECTION IV SOLN
INTRAVENOUS | Status: DC
  Administered 2018-03-16 – 2018-03-17 (×5): via INTRAVENOUS
  Filled 2018-03-16 (×8): qty 850

## 2018-03-16 MED ORDER — VASOPRESSIN 20 UNIT/ML IV SOLN
0.0300 [IU]/min | INTRAVENOUS | Status: DC
  Administered 2018-03-16 – 2018-03-18 (×3): 0.03 [IU]/min via INTRAVENOUS
  Filled 2018-03-16 (×3): qty 2

## 2018-03-16 MED ORDER — SODIUM CHLORIDE 0.9% FLUSH
5.0000 mL | Freq: Three times a day (TID) | INTRAVENOUS | Status: DC
  Administered 2018-03-16 – 2018-03-17 (×4): 5 mL

## 2018-03-16 MED ORDER — NOREPINEPHRINE 16 MG/250ML-% IV SOLN
0.0000 ug/min | INTRAVENOUS | Status: DC
  Administered 2018-03-16: 40 ug/min via INTRAVENOUS
  Administered 2018-03-16: 45 ug/min via INTRAVENOUS
  Administered 2018-03-17: 40 ug/min via INTRAVENOUS
  Administered 2018-03-17: 27 ug/min via INTRAVENOUS
  Administered 2018-03-18: 16 ug/min via INTRAVENOUS
  Administered 2018-03-18: 35 ug/min via INTRAVENOUS
  Filled 2018-03-16 (×5): qty 250

## 2018-03-16 MED ORDER — MIDAZOLAM HCL 2 MG/2ML IJ SOLN
INTRAMUSCULAR | Status: AC
  Filled 2018-03-16: qty 2

## 2018-03-16 MED ORDER — SODIUM CHLORIDE 0.9 % IV SOLN
2.0000 g | INTRAVENOUS | Status: DC
  Filled 2018-03-16: qty 20

## 2018-03-16 MED ORDER — SODIUM BICARBONATE 8.4 % IV SOLN
INTRAVENOUS | Status: AC
  Administered 2018-03-16: 100 meq via INTRAVENOUS
  Filled 2018-03-16: qty 100

## 2018-03-16 MED ORDER — FENTANYL BOLUS VIA INFUSION
25.0000 ug | INTRAVENOUS | Status: DC | PRN
  Administered 2018-03-17 – 2018-03-18 (×4): 25 ug via INTRAVENOUS
  Filled 2018-03-16: qty 25

## 2018-03-16 MED ORDER — DEXMEDETOMIDINE HCL IN NACL 400 MCG/100ML IV SOLN
0.0000 ug/kg/h | INTRAVENOUS | Status: DC
  Administered 2018-03-17: 0.5 ug/kg/h via INTRAVENOUS
  Filled 2018-03-16 (×2): qty 100

## 2018-03-16 MED ORDER — ROCURONIUM BROMIDE 50 MG/5ML IV SOLN
50.0000 mg | Freq: Once | INTRAVENOUS | Status: AC
  Administered 2018-03-16: 50 mg via INTRAVENOUS
  Filled 2018-03-16: qty 5

## 2018-03-16 MED ORDER — MIDAZOLAM HCL 2 MG/2ML IJ SOLN
INTRAMUSCULAR | Status: AC | PRN
  Administered 2018-03-16 (×2): 0.5 mg via INTRAVENOUS

## 2018-03-16 MED ORDER — SODIUM BICARBONATE 8.4 % IV SOLN
100.0000 meq | Freq: Once | INTRAVENOUS | Status: AC
  Administered 2018-03-16: 100 meq via INTRAVENOUS
  Filled 2018-03-16: qty 100

## 2018-03-16 MED ORDER — STERILE WATER FOR INJECTION IV SOLN
INTRAVENOUS | Status: DC
  Administered 2018-03-16 – 2018-03-18 (×9): via INTRAVENOUS_CENTRAL
  Filled 2018-03-16 (×11): qty 150

## 2018-03-16 MED ORDER — FAMOTIDINE IN NACL 20-0.9 MG/50ML-% IV SOLN: 20.0000 mg | INTRAVENOUS | Status: DC

## 2018-03-16 MED ORDER — LIDOCAINE HCL 1 % IJ SOLN
INTRAMUSCULAR | Status: AC
  Filled 2018-03-16: qty 20

## 2018-03-16 MED ORDER — ORAL CARE MOUTH RINSE
15.0000 mL | Freq: Two times a day (BID) | OROMUCOSAL | Status: DC
  Administered 2018-03-16: 15 mL via OROMUCOSAL

## 2018-03-16 MED ORDER — PIPERACILLIN-TAZOBACTAM 3.375 G IVPB
3.3750 g | Freq: Three times a day (TID) | INTRAVENOUS | Status: DC
  Filled 2018-03-16: qty 50

## 2018-03-16 MED ORDER — SODIUM CHLORIDE 0.9 % IV SOLN
INTRAVENOUS | Status: DC | PRN
  Administered 2018-03-16: 500 mL via INTRAVENOUS

## 2018-03-16 MED ORDER — DEXTROSE 50 % IV SOLN
INTRAVENOUS | Status: AC
  Administered 2018-03-16: 12 mL
  Filled 2018-03-16: qty 50

## 2018-03-16 MED ORDER — NOREPINEPHRINE 4 MG/250ML-% IV SOLN
0.0000 ug/min | INTRAVENOUS | Status: DC
  Administered 2018-03-16 (×2): 48 ug/min via INTRAVENOUS
  Administered 2018-03-16: 35 ug/min via INTRAVENOUS
  Administered 2018-03-16: 24 ug/min via INTRAVENOUS
  Administered 2018-03-16: 5 ug/min via INTRAVENOUS
  Filled 2018-03-16 (×5): qty 250

## 2018-03-16 MED ORDER — FENTANYL CITRATE (PF) 100 MCG/2ML IJ SOLN
INTRAMUSCULAR | Status: AC | PRN
  Administered 2018-03-16 (×2): 25 ug via INTRAVENOUS

## 2018-03-16 MED ORDER — DEXTROSE 50 % IV SOLN
INTRAVENOUS | Status: AC
  Administered 2018-03-16: 25 mL
  Filled 2018-03-16: qty 50

## 2018-03-16 MED ORDER — ORAL CARE MOUTH RINSE
15.0000 mL | OROMUCOSAL | Status: DC
  Administered 2018-03-17 – 2018-03-18 (×18): 15 mL via OROMUCOSAL

## 2018-03-16 MED ORDER — CHLORHEXIDINE GLUCONATE 0.12 % MT SOLN: 15.0000 mL | Freq: Two times a day (BID) | OROMUCOSAL | Status: DC

## 2018-03-16 MED ORDER — MIDAZOLAM HCL 2 MG/2ML IJ SOLN
2.0000 mg | Freq: Once | INTRAMUSCULAR | Status: AC
  Administered 2018-03-16: 2 mg via INTRAVENOUS

## 2018-03-16 MED ORDER — FENTANYL CITRATE (PF) 100 MCG/2ML IJ SOLN: 50.0000 ug | Freq: Once | INTRAMUSCULAR | Status: DC

## 2018-03-16 MED ORDER — SODIUM CHLORIDE 0.9 % IV SOLN: INTRAVENOUS | Status: DC | PRN

## 2018-03-16 MED ORDER — PIPERACILLIN-TAZOBACTAM 3.375 G IVPB
3.3750 g | Freq: Two times a day (BID) | INTRAVENOUS | Status: DC
  Administered 2018-03-16: 3.375 g via INTRAVENOUS
  Filled 2018-03-16 (×2): qty 50

## 2018-03-16 MED ORDER — SODIUM CHLORIDE 0.9 % FOR CRRT
INTRAVENOUS_CENTRAL | Status: DC | PRN
  Administered 2018-03-16: 21:00:00 via INTRAVENOUS_CENTRAL
  Filled 2018-03-16 (×2): qty 1000

## 2018-03-16 MED ORDER — ETOMIDATE 2 MG/ML IV SOLN
20.0000 mg | Freq: Once | INTRAVENOUS | Status: AC
  Administered 2018-03-16: 20 mg via INTRAVENOUS

## 2018-03-16 MED ORDER — EPINEPHRINE PF 1 MG/ML IJ SOLN
0.5000 ug/min | INTRAVENOUS | Status: AC
  Administered 2018-03-16: 0.5 ug/min via INTRAVENOUS
  Filled 2018-03-16: qty 4

## 2018-03-16 MED ORDER — HYDROCORTISONE NA SUCCINATE PF 100 MG IJ SOLR
50.0000 mg | Freq: Four times a day (QID) | INTRAMUSCULAR | Status: DC
  Administered 2018-03-16 – 2018-03-18 (×8): 50 mg via INTRAVENOUS
  Filled 2018-03-16 (×8): qty 2

## 2018-03-16 MED ORDER — CHLORHEXIDINE GLUCONATE 0.12% ORAL RINSE (MEDLINE KIT)
15.0000 mL | Freq: Two times a day (BID) | OROMUCOSAL | Status: DC
  Administered 2018-03-16 – 2018-03-18 (×4): 15 mL via OROMUCOSAL

## 2018-03-16 MED ORDER — NOREPINEPHRINE 16 MG/250ML-% IV SOLN
0.0000 ug/min | INTRAVENOUS | Status: DC
  Filled 2018-03-16: qty 250

## 2018-03-16 MED ORDER — METRONIDAZOLE IN NACL 5-0.79 MG/ML-% IV SOLN: 500.0000 mg | Freq: Three times a day (TID) | INTRAVENOUS | Status: DC

## 2018-03-16 MED ORDER — IOPAMIDOL (ISOVUE-300) INJECTION 61%
INTRAVENOUS | Status: AC
  Administered 2018-03-16: 5 mL
  Filled 2018-03-16: qty 50

## 2018-03-16 MED ORDER — SODIUM CHLORIDE 0.9 % IV SOLN
1.2500 ng/kg/min | INTRAVENOUS | Status: AC
  Administered 2018-03-16: 5 ng/kg/min via INTRAVENOUS
  Administered 2018-03-17 (×2): 40 ng/kg/min via INTRAVENOUS
  Filled 2018-03-16 (×3): qty 1

## 2018-03-16 MED ORDER — FENTANYL 2500MCG IN NS 250ML (10MCG/ML) PREMIX INFUSION
25.0000 ug/h | INTRAVENOUS | Status: DC
  Administered 2018-03-16: 25 ug/h via INTRAVENOUS
  Administered 2018-03-17: 125 ug/h via INTRAVENOUS
  Filled 2018-03-16 (×2): qty 250

## 2018-03-16 MED ORDER — MIDAZOLAM HCL 2 MG/2ML IJ SOLN
INTRAMUSCULAR | Status: AC
  Filled 2018-03-16: qty 4

## 2018-03-16 NOTE — Progress Notes (Addendum)
Pharmacy Antibiotic Note  Caitlin Yates is a 78 y.o. female admitted on 03/03/2018 with Intraabdominal infection.  Pharmacy has been consulted for piperacillin/tazobactam dosing.  Plan: Change Piperacillin/tazobactam to 3.375 gm IV q12h (for CrCl<20 ml/min)  Height: 5\' 6"  (167.6 cm) Weight: 170 lb 10.2 oz (77.4 kg) IBW/kg (Calculated) : 59.3  Temp (24hrs), Avg:99.5 F (37.5 C), Min:97.9 F (36.6 C), Max:103.5 F (39.7 C)  Recent Labs  Lab 02/28/2018 1704 03/12/2018 2200 02/19/2018 2201 03/02/2018 2358 03/16/18 0226 03/16/18 0239 03/16/18 0513 03/16/18 0529 03/16/18 0912 03/16/18 1157  WBC 1.0*  --   --   --   --  4.7  --  7.6  --  10.2  CREATININE 1.64* 2.20*  --   --  2.93*  --   --  3.15*  --  3.58*  LATICACIDVEN  --   --  7.3* 7.4*  --  7.3* 6.4*  --  5.8*  --     Estimated Creatinine Clearance: 13.4 mL/min (A) (by C-G formula based on SCr of 3.58 mg/dL (H)).    Allergies  Allergen Reactions  . Lisinopril Swelling    Tongue swelling  . Statins     Muscle aches   Crysta Gulick A. Levada Dy, PharmD, [place] Please utilize Amion for appropriate phone number to reach the unit pharmacist ([place])  Addendum: Patient to start CRRT, will change dose to 3.375gm IV q6h (30 min infusions)  Wilver Tignor A. Levada Dy, PharmD, [place] Please utilize Amion for appropriate phone number to reach the unit pharmacist ([place])       03/16/2018 3:32 PM

## 2018-03-16 NOTE — Progress Notes (Signed)
Pt continuing to require more vasopressors, now close to max dose of levo for PIV. Pt w/ no CVC and no A-line, Bipap @ 80%FiO2.  Spoke w/ CCM MD regarding need for central line and additional pressors.  CCM MD at bedside assessing. Discussing need for CVC, intubation, and potential dialysis with pt and pts family.

## 2018-03-16 NOTE — ED Notes (Signed)
[facility] on phone with ccm who was made aware of repeat lactic acid of 7.3. additional orders given,

## 2018-03-16 NOTE — ED Notes (Signed)
Dr. Shanon Brow called ED to ask if a bed had been approved for pt and if [facility] was here to take her

## 2018-03-16 NOTE — Procedures (Signed)
Central Venous Catheter Insertion Procedure Note Caitlin Yates [id number] 1940-09-05  Procedure: Insertion of Central Venous Catheter Indications: Drug and/or fluid administration  Procedure Details Consent: Risks of procedure as well as the alternatives and risks of each were explained to the (patient/caregiver).  Consent for procedure obtained. Time Out: Verified patient identification, verified procedure, site/side was marked, verified correct patient position, special equipment/implants available, medications/allergies/relevent history reviewed, required imaging and test results available.  Performed  Maximum sterile technique was used including antiseptics, cap, gloves, gown, hand hygiene, mask and sheet. Skin prep: Chlorhexidine; local anesthetic administered A antimicrobial bonded/coated triple lumen catheter was placed in the left internal jugular vein using the Seldinger technique.  Evaluation Blood flow good Complications: No apparent complications Patient did tolerate procedure well. Chest X-ray ordered to verify placement.  CXR: pending.   Central line placed with ultra sound to provide live- time visualization of the vessel. The procedure was performed under the direct supervision of Dr. Valeta Harms. The patient tolerated procedure  well  Caitlin Yates, AGACNP-BC 03/16/2018, 3:59 PM

## 2018-03-16 NOTE — Care Plan (Signed)
Chart reviewed call from the emergency room nurse for patient starting to deteriorate becoming more tachycardic and hypotensive and hypoxic.  Repeat lactic acid level after 3 L of fluid is still over 7.  Providing patient with another 2 L of IV fluids.  Have called Dr. Jimmy Footman with PCCM at [facility] who has accepted the [facility] to [facility] ICU team under Dr. Agustina Caroli name.  I have also spoken to Dr. Arnoldo Morale here general surgery team at [facility] who is recommending due to patient's deterioration to be transferred to [facility] for IR intervention for decompression of her gallbladder.  I have spoken to the surgery team Dr. Grandville Silos at [facility] who is aware of the patient who agrees with getting IR involved.  I have spoken to Dr. Vernard Gambles with the interventional radiology team at [facility] who will also see the patient on arrival.  Have repeat CBC BMP and lactic acid pending at this time.  Have called [facility] and have arranged for patient to be transferred to [facility], ICU tonight.  Have written a care order for the nursing staff that once patient arrived to [facility], ICU to call both Dr. Grandville Silos and Dr. Vernard Gambles to make them aware that she is there along with the critical care team there.

## 2018-03-16 NOTE — Procedures (Signed)
Arterial Catheter Insertion Procedure Note Caitlin Yates [id number] March 07, 1940  Procedure: Insertion of Arterial Catheter  Indications: Blood pressure monitoring and Frequent blood sampling  Procedure Details Consent: Unable to obtain consent because of emergent medical necessity. Time Out: Verified patient identification, verified procedure, site/side was marked, verified correct patient position, special equipment/implants available, medications/allergies/relevent history reviewed, required imaging and test results available.  Performed  Maximum sterile technique was used including antiseptics, cap, gloves, gown, hand hygiene, mask and sheet. Skin prep: Chlorhexidine; local anesthetic administered 20 gauge catheter was inserted into right radial artery using the Seldinger technique. ULTRASOUND GUIDANCE USED: NO Evaluation Blood flow good; BP tracing good. Complications: No apparent complications.   Ander Purpura 03/16/2018

## 2018-03-16 NOTE — Progress Notes (Signed)
Pt continues to decline.  Pts family yet to make decision regarding intubation and CVC. Pts family states waiting on all of siblings to arrive.  This RN as well as MD have discussed multiple times w/ family the severity of pts condition and the need for a decision asap.  Pt now requiring epi to maintain blood pressure, starting gtt now.

## 2018-03-16 NOTE — Procedures (Signed)
Intubation Procedure Note Caitlin Yates [id number] 1940-03-06  Procedure: Intubation Indications: Airway protection and maintenance  Procedure Details Consent: Risks of procedure as well as the alternatives and risks of each were explained to the (patient/caregiver).  Consent for procedure obtained. Time Out: Verified patient identification, verified procedure, site/side was marked, verified correct patient position, special equipment/implants available, medications/allergies/relevent history reviewed, required imaging and test results available.  Performed  Maximum sterile technique was used including gloves, gown, hand hygiene and mask.  MAC video laryngoscope was used with a placement of a 7.5 ET tube.  Patient was sedated with 50 mcg of fentanyl, 2 mg of Versed, preoxygenated on 100% BiPAP 14/5.  Once positioned in bed for appropriate sniffing position with pillow under posterior upper back etomidate 20 mg was given followed by 50 of rocuronium.  This was allowed to circulate for full paralysis patient's O2 sat remained 100% during this time.  A grade 2 view of the cords were obtained and the endotracheal tube was successfully passed.  There was appropriate color change on the CO2 detector.  Evaluation Hemodynamic Status: BP stable throughout; O2 sats: stable throughout Patient's Current Condition: critically ill  Complications: No apparent complications Patient did tolerate procedure well. Chest X-ray ordered to verify placement.  CXR: pending.   Octavio Graves Caitlin Yates 03/16/2018

## 2018-03-16 NOTE — ED Notes (Signed)
Caitlin Reichmann, RN from [facility] talked to Dr. Shanon Brow about coming down to ED to assess the pt the CCM Dr wanted to happen before transferring her to [facility]. Dr. Shanon Brow said that after seeing her ABG she was fine to go. Dr. Shanon Brow did not come down to assess pt.

## 2018-03-16 NOTE — ED Notes (Addendum)
Messaged Dr. Derrill Kay about pt's O2 being 86% on NRB, currently unread.

## 2018-03-16 NOTE — Progress Notes (Addendum)
[facility] Physician-Brief Progress Note Patient Name: Caitlin Yates DOB: 06/16/1940 MRN: [id number]   Date of Service  03/16/2018  HPI/Events of Note  LA > 11. Notes, labs, Camera asssement done. Acute hemorrhagic cholecystitis, Septic shock, Liver injury. Just got trialysis cath going for CRRT. sats 95%. In synchrony. P/F 100. 85/58.   eICU Interventions  Follow LA at mid night.  Has elevated LA from delayed clearence from Liver failure and septic shock.    Discussed with bed side RN.  Ordered LA for Mid night. Start Hydrocort 50 mg q 6 hr. BG < 80 also. On 3 pressors.   Intervention Category Major Interventions: Sepsis - evaluation and management Intermediate Interventions: Diagnostic test evaluation  Elmer Sow 03/16/2018, 9:30 PM

## 2018-03-16 NOTE — Progress Notes (Addendum)
PCCM:  Family meeting at bedside.  Patient has progressed to multiorgan failure requiring significant vasopressor support and likely need for mechanical life support. Patient is currently receiving all of these medications via peripheral lines.  Upon my initial evaluation the patient was on 30 mcg of Levophed.  I met with the patient's 2 sisters as well as additional loved ones in the conference room.  We also met at the patient's bedside.  The patient was able to verbalize who she was in the presence of each of the family members names in the room.  Therefore once we clarified that we felt she was able to help participate in this discussion.  When asked the patient stated she would not want mechanical life support.  However upon further discussion with the family stated that if that was necessary she would be okay to try it.  However when we approach the subject for the need of dialysis the patient also stated no that she would not want dialysis.  Therefore the family decided to spend some time alone with her in the room.  Then the family came out from the room asking [place] to continue to keep her alive at the moment while additional family members are on their way to the bedside.  She has 2 additional children coming to the hospital.  The family then also decided to regroup and go back to the conference room for further discussions.  At this point, approximately 1:15PM the patient is maxed on Levophed with a map in the high 60s.  She is awake alert conversant.  Full progress note to follow.  Additional notes to update once we have more family decisions.  Garner Nash, DO [facility] 03/16/2018 1:20 PM   78 yo FM, MODS, on maxed pressors.   Final decision made for FULL CODE. We will proceed with intubation and CVC placement.   Garner Nash, DO [facility] 03/16/2018 2:04 PM  Personal pager: [phone] If unanswered, please page CCM On-call:  [phone]   Patient with decompensating multiorgan failure and septic shock. Titrating vasopressors. Patient started on angiotensin II, will attempt to wean epinephrine, norepinephrine and vasopressin. 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  Additional family up states given post intubation.  We will titrate vasopressors to maintain mean arterial pressure greater than 65 mmHg. Antibiotics with piperacillin tazobactam.  Patient seen reexamined and reassessed this afternoon. Critically ill, on mechanical ventilation Bilateral coarse breath sounds Abdomen soft nontender No significant edema.  Multiorgan failure septic shock Acute hypoxemic respiratory failure requiring intubation mechanical ventilation Septic shock requiring multiple vasopressors Severe anion gap metabolic acidosis Lactic acidosis Titrating vasopressors as above Antibiotics as above Consults nephrology We appreciate their recommendations  Family updated  This patient is critically ill with multiple organ system failure; which, requires frequent high complexity decision making, assessment, support, evaluation, and titration of therapies. This was completed through the application of advanced monitoring technologies and extensive interpretation of multiple databases. During this encounter critical care time was devoted to patient care services described in this note for 90 minutes.   Garner Nash, DO [facility] 03/16/2018 4:48 PM  Personal pager: [phone] If unanswered, please page CCM On-call: [phone]

## 2018-03-16 NOTE — ED Notes (Signed)
Dr. Shanon Brow called charge, Rip Harbour RN, to let her know that she had put in for the pt to go to [facility] ICU. Rip Harbour, RN updated Dr. Shanon Brow on pt's status, including low BP, and she said to continue the 2L bolus

## 2018-03-16 NOTE — ED Notes (Signed)
Called pt's daughter Enid Derry and made her aware that she was being moved to [facility] ICU

## 2018-03-16 NOTE — H&P (Signed)
NAME:  Caitlin Yates, MRN:  [id number], DOB:  07-18-1940, LOS: 1 ADMISSION DATE:  02/26/2018, CONSULTATION DATE:  03/16/2018 REFERRING MD:  Abbygayle Sella, CHIEF COMPLAINT:  Abdominal pain  Brief History   78 year old woman with DM, HTN, GERD, HLD presented with abdominal pain found to have acute hemorrhagic cholecystitis with septic shock.  History of present illness   78 year old woman with DM, HTN, GERD, HLD presented with abdominal pain. History obtained from EMR due to acute encephalopathy. Abdominal pain acute onset yesterday around 2PM that is in the RUQ mostly. No recent illnesses, med changes.   In the ED temp up to 103.5 and tachycardic to 113 with blood pressure drop to 78/27.   Past Medical History  DM, HTN, GERD, HLD  Significant Hospital Events   2/29 > admit to hospitalist at [facility] 3/1 > transfer to [facility] ICU  Consults:  IR 3/1 Gen Surg 3/1  Procedures:  None  Significant Diagnostic Tests:  CT abd/pelvis w/o contrast 2/29 > Distended gallbladder with high attenuation material. Small bilateral adrenal gland adenomas. Severe atherosclerotic calcifications involving the aorta and iliac arteries and branch vessels. There also three-vessel coronary artery calcifications.  XR abd/chest 2/29 > low lung volumes with bibasilar opacification Micro Data:  BCx 2/29> UCx 2/29>  Antimicrobials:  Zosyn 2/29 Ceftriaxone 3/1>  Interim history/subjective:  Eyes closed but opens eyes to voice. Mumbling some answers.  Objective   Blood pressure (!) 88/45, pulse (!) 104, temperature (!) 103.5 F (39.7 C), temperature source Rectal, resp. rate (!) 28, height 5\' 6"  (1.676 m), weight 76.2 kg, SpO2 99 %.        Intake/Output Summary (Last 24 hours) at 03/16/2018 0441 Last data filed at 02/15/2018 2122 Gross per 24 hour  Intake 1550.22 ml  Output -  Net 1550.22 ml   Filed Weights   03/02/2018 1618  Weight: 76.2 kg    Examination: General: Mild distress HENT:  [place]/AT Lungs: Few faint rhonchi, but clear otherwise, no wheezes Cardiovascular: Tachycardic but regular Abdomen: Soft, tender to palpation most in RUQ, mildly distended Extremities: No LE edema Neuro: Lethargic but opens eyes to voice, mumbling some answers but able to say name, follows commands GU: Foley in place  Assessment & Plan:  78 year old woman with DM, HTN, GERD, HLD presented with abdominal pain found to have hemorrhagic cholecystitis with septic shock.  Acute hemorrhagic cholecystitis, Acute liver injury, septic shock: Fever to 103.5. Tachycardic and hypotensive to 78/27. WBC up to 4.7 from 1 on admission. Hgb 8.5 and normal Plt. AST 4149, ALT 1913 with elevated T bili to 1.4. PT/INR normal. Possible shock liver with cholecystitis.  --Gen surg and IR consulted. --Got one dose of Zosyn. Will change to ceftriaxone --BP improved. Wean levo gtt for MAP goal >65 --follow up blood cultures, urine culture --Obtain US liver to evaluate further for biliary obstruction --Follow liver function --Holding home ASA and Plavix. Hold statin for now given acute liver injury.  Acute hypoxic respiratory failure: ABG 7.35/25.6/103. EKG with sinus tachycardia, nonspecific T wave changes.  --BiPAP for now   Metabolic acidosis with anion gap, lactic acidosis, DKA: Bicarb up to 16. Anion gap down to 15 from 17. LA 7.3 from 7.4. --Trend LA --Recheck BMP. If gap remains closed, can d/c insulin gtt and transition to [place].  AKI: Cr up to 2.93 --Continue to monitor  DKA, DM: Ketones in urine. Improving. --Check Hgb A1c --Follow up BMP, see above for insulin gtt plan --Patient already received  Lantus in ED. Continue.  --Hold home glimipiride, semaglutide, Janumet  HTN: Hold home amlodipine, atenolol, chlorthalidone, spironolactone  Best practice:  Diet: NPO Pain/Anxiety/Delirium protocol (if indicated): Monitor. Denies pain currently. VAP protocol (if indicated): N/A DVT prophylaxis: SCDs GI  prophylaxis: None Glucose control: SSI and Lantus Mobility: OOB Code Status: FULL Family Communication: Discussed with patient. No family at bedside Disposition: ICU  Labs   CBC: Recent Labs  Lab 02/27/2018 1704 03/16/18 0239  WBC 1.0* 4.7  NEUTROABS 0.7*  --   HGB 10.7* 8.5*  HCT 33.6* 27.6*  MCV 92.6 95.5  PLT 404* 403    Basic Metabolic Panel: Recent Labs  Lab 03/02/2018 1704 03/13/2018 2200 03/16/18 0226  NA 136 134* 136  K 3.4* 4.4 4.2  CL 99 101 105  CO2 20* 16* 16*  GLUCOSE 325* 303* 194*  BUN 35* 44* 51*  CREATININE 1.64* 2.20* 2.93*  CALCIUM 9.9 8.9 8.2*   GFR: Estimated Creatinine Clearance: 16.2 mL/min (A) (by C-G formula based on SCr of 2.93 mg/dL (H)). Recent Labs  Lab 03/06/2018 1704 03/10/2018 2201 03/03/2018 2358 03/16/18 0239  WBC 1.0*  --   --  4.7  LATICACIDVEN  --  7.3* 7.4* 7.3*    Liver Function Tests: Recent Labs  Lab 02/26/2018 1704  AST 4,149*  ALT 1,913*  ALKPHOS 285*  BILITOT 1.4*  PROT 7.1  ALBUMIN 3.8   Recent Labs  Lab 03/03/2018 1704  LIPASE 69*   ABG    Component Value Date/Time   PHART 7.350 03/16/2018 0320   PCO2ART 25.6 (L) 03/16/2018 0320   PO2ART 103 03/16/2018 0320   HCO3 16.0 (L) 03/16/2018 0320   ACIDBASEDEF 10.8 (H) 03/16/2018 0320   O2SAT 97.0 03/16/2018 0320     Coagulation Profile: Recent Labs  Lab 02/27/2018 1704  INR 1.1    Cardiac Enzymes: Recent Labs  Lab 02/18/2018 1704  TROPONINI <0.03    CBG: Recent Labs  Lab 03/11/2018 2328 03/16/18 0036 03/16/18 0137 03/16/18 0222 03/16/18 0320  GLUCAP 270* 213* 185* 169* 212*    Review of Systems:   Unable to obtain due to mental status  Past Medical History  She,  has a past medical history of Arthritis, Diabetes mellitus without complication ([place]), GERD (gastroesophageal reflux disease), Hypercholesterolemia, and Hypertension.   Surgical History    Past Surgical History:  Procedure Laterality Date  . ABDOMINAL HYSTERECTOMY    . vascular  stent right leg       Social History   reports that she has never smoked. She has never used smokeless tobacco. She reports that she does not drink alcohol or use drugs.   Family History   Her family history includes CVA in her mother; Heart attack in her brother; Hypertension in her mother. There is no history of Colon cancer.   Allergies Allergies  Allergen Reactions  . Lisinopril Swelling    Tongue swelling  . Statins     Muscle aches     Home Medications  Prior to Admission medications   Medication Sig Start Date End Date Taking? Authorizing Provider  amLODipine (NORVASC) 10 MG tablet TAKE 1 TABLET BY MOUTH ONCE DAILY. Patient taking differently: Take 10 mg by mouth every morning.  05/06/17  Yes Branch, Alphonse Guild, MD  aspirin EC 81 MG tablet Take 81 mg by mouth every morning.   Yes [provider]  atenolol-chlorthalidone (TENORETIC) 50-25 MG per tablet Take 1 tablet by mouth every morning.   Yes [provider]  atorvastatin (LIPITOR) 10 MG tablet Take 10 mg by mouth at bedtime.    Yes [provider]  clopidogrel (PLAVIX) 75 MG tablet Take 75 mg by mouth every morning.   Yes [provider]  HYDROcodone-acetaminophen (NORCO/VICODIN) 5-325 MG tablet Take 1 tablet by mouth every 6 (six) hours as needed for moderate pain.   Yes [provider]  predniSONE (DELTASONE) 20 MG tablet Take 20 mg by mouth daily with breakfast. 5 day course   Yes [provider]  cyclobenzaprine (FLEXERIL) 10 MG tablet Take 10 mg by mouth 3 (three) times daily as needed for muscle spasms.    [provider]  DEXILANT 60 MG capsule Take 60 mg by mouth daily.  07/16/14   [provider]  gabapentin (NEURONTIN) 300 MG capsule Take 300 mg by mouth 3 (three) times daily.    [provider]  glimepiride (AMARYL) 4 MG tablet Take 4 mg by mouth daily with breakfast.  02/04/15   [provider]  Multiple Vitamins-Minerals  (CENTURY MATURE ADULT FORMULA PO) Take 1 tablet by mouth every morning.    [provider]  naproxen (NAPROSYN) 500 MG tablet Take 500 mg by mouth every 12 (twelve) hours as needed.  09/11/16   [provider]  potassium chloride (K-DUR) 10 MEQ tablet Take 1 tablet (10 mEq total) by mouth daily. 12/28/16   Milton Ferguson, MD  Semaglutide,0.25 or 0.5MG /DOS, (OZEMPIC, 0.25 OR 0.5 MG/DOSE,) 2 MG/1.5ML SOPN Inject into the skin.    [provider]  sitaGLIPtan-metformin (JANUMET) 50-1000 MG per tablet Take 1 tablet by mouth 2 (two) times daily.    [provider]  spironolactone (ALDACTONE) 25 MG tablet Take 25 mg by mouth daily.    [provider]  spironolactone (ALDACTONE) 25 MG tablet TAKE (1/2) TABLET BY MOUTH ONCE DAILY. 09/04/17   Arnoldo Lenis, MD  Vitamin D, Ergocalciferol, (DRISDOL) 50000 units CAPS capsule Take 50,000 Units by mouth every Wednesday.    [provider]     Critical care time: The patient is critically ill with multiple organ systems failure and requires high complexity decision making for assessment and support, frequent evaluation and titration of therapies, application of advanced monitoring technologies and extensive interpretation of multiple databases.   Critical Care Time devoted to patient care services described in this note is  50 Minutes. This time reflects time of care of this signee. This critical care time does not reflect procedure time, or teaching time or supervisory time of PA/NP/Med student/Med Resident etc but could involve care discussion time.  Jacques Earthly, M.D. [facility] After hours pager: [phone].

## 2018-03-16 NOTE — Progress Notes (Signed)
Received call from lab, pts lactic:>11. Relayed to CCM NP

## 2018-03-16 NOTE — ED Notes (Signed)
This RN had called Dr. Nehemiah Settle about possibly transferring pt to [facility] for stepdown bed since we did not have any available here. Dr. Nehemiah Settle said that there was no need, we can get her better here.

## 2018-03-16 NOTE — Procedures (Signed)
  Procedure: [place] perc chole 22f EBL:   minimal Complications:  none immediate  See full dictation in [facility].  Dillard Cannon MD Main # [phone] Pager  [phone]

## 2018-03-16 NOTE — ED Notes (Signed)
Dr. Shanon Brow hasn't been down to evaluate pt

## 2018-03-16 NOTE — Consult Note (Signed)
Reason for Consult:cholecystitis with sepsis Referring Physician: Sheffield Slider is an 78 y.o. female.  HPI: Patient presented to the [facility] emergency department yesterday with acute onset of right upper quadrant abdominal pain.  She was diagnosed with acute cholecystitis.  She was admitted to the medical service but began to worsen clinically including hypotension and lactic acidosis.  She was transferred to the critical care medicine service at [facility] with plan for interventional radiology to place a percutaneous cholecystostomy tube.  I was asked to see her for additional surgical recommendations.  She is currently on BiPAP and cannot participate much with history.  Past Medical History:  Diagnosis Date  . Arthritis   . Diabetes mellitus without complication ([place])   . GERD (gastroesophageal reflux disease)   . Hypercholesterolemia   . Hypertension     Past Surgical History:  Procedure Laterality Date  . ABDOMINAL HYSTERECTOMY    . vascular stent right leg      Family History  Problem Relation Age of Onset  . CVA Mother   . Hypertension Mother   . Heart attack Brother   . Colon cancer Neg Hx     Social History:  reports that she has never smoked. She has never used smokeless tobacco. She reports that she does not drink alcohol or use drugs.  Allergies:  Allergies  Allergen Reactions  . Lisinopril Swelling    Tongue swelling  . Statins     Muscle aches    Medications: I have reviewed the patient's current medications.  Results for orders placed or performed during the hospital encounter of 02/27/2018 (from the past 48 hour(s))  Lipase, blood     Status: Abnormal   Collection Time: 03/11/2018  5:04 PM  Result Value Ref Range   Lipase 69 (H) 11 - 51 U/L    Comment: Performed at [facility], [address], [place], [place] [address]  Comprehensive metabolic panel     Status: Abnormal   Collection Time: 02/17/2018  5:04 PM  Result Value Ref Range    Sodium 136 135 - 145 mmol/L   Potassium 3.4 (L) 3.5 - 5.1 mmol/L   Chloride 99 98 - 111 mmol/L   CO2 20 (L) 22 - 32 mmol/L   Glucose, Bld 325 (H) 70 - 99 mg/dL   BUN 35 (H) 8 - 23 mg/dL   Creatinine, Ser 1.64 (H) 0.44 - 1.00 mg/dL   Calcium 9.9 8.9 - 10.3 mg/dL   Total Protein 7.1 6.5 - 8.1 g/dL   Albumin 3.8 3.5 - 5.0 g/dL   AST 4,149 (H) 15 - 41 U/L    Comment: RESULTS CONFIRMED BY MANUAL DILUTION   ALT 1,913 (H) 0 - 44 U/L   Alkaline Phosphatase 285 (H) 38 - 126 U/L   Total Bilirubin 1.4 (H) 0.3 - 1.2 mg/dL   GFR calc non Af Amer 29 (L) >60 mL/min   GFR calc Af Amer 34 (L) >60 mL/min   Anion gap 17 (H) 5 - 15    Comment: Performed at [facility], [address] [place], [place] [address]  CBC with Differential     Status: Abnormal   Collection Time: 02/17/2018  5:04 PM  Result Value Ref Range   WBC 1.0 (LL) 4.0 - 10.5 K/uL    Comment: This critical result has verified and been called to OAK[place],B by Duncan Dull on 02 29 2020 at 1756, and has been read back.    RBC  3.63 (L) 3.87 - 5.11 MIL/uL   Hemoglobin 10.7 (L) 12.0 - 15.0 g/dL   HCT 33.6 (L) 36.0 - 46.0 %   MCV 92.6 80.0 - 100.0 fL   MCH 29.5 26.0 - 34.0 pg   MCHC 31.8 30.0 - 36.0 g/dL   RDW 13.1 11.5 - 15.5 %   Platelets 404 (H) 150 - 400 K/uL   nRBC 0.0 0.0 - 0.2 %   Neutrophils Relative % 69 %   Neutro Abs 0.7 (L) 1.7 - 7.7 K/uL   Lymphocytes Relative 27 %   Lymphs Abs 0.3 (L) 0.7 - 4.0 K/uL   Monocytes Relative 2 %   Monocytes Absolute 0.0 (L) 0.1 - 1.0 K/uL   Eosinophils Relative 0 %   Eosinophils Absolute 0.0 0.0 - 0.5 K/uL   Basophils Relative 1 %   Basophils Absolute 0.0 0.0 - 0.1 K/uL   Immature Granulocytes 1 %   Abs Immature Granulocytes 0.01 0.00 - 0.07 K/uL    Comment: Performed at [facility], [address], [place], [place] [address]  Troponin I - ONCE - STAT     Status: None   Collection Time: 02/21/2018  5:04 PM  Result Value Ref Range   Troponin I <0.03 <0.03 ng/mL    Comment: Performed at [facility], [address] [place], [place] [address]  Protime-INR     Status: None   Collection Time: 02/16/2018  5:04 PM  Result Value Ref Range   Prothrombin Time 14.0 11.4 - 15.2 seconds   INR 1.1 0.8 - 1.2    Comment: (NOTE) INR goal varies based on device and disease states. Performed at [facility], [address], [place], [place] [address]   Urinalysis, Routine w reflex microscopic     Status: Abnormal   Collection Time: 02/24/2018  8:46 PM  Result Value Ref Range   Color, Urine AMBER (A) YELLOW    Comment: BIOCHEMICALS MAY BE AFFECTED BY COLOR   APPearance TURBID (A) CLEAR   Specific Gravity, Urine 1.029 1.005 - 1.030   pH 5.0 5.0 - 8.0   Glucose, UA 50 (A) NEGATIVE mg/dL   Hgb urine dipstick SMALL (A) NEGATIVE   Bilirubin Urine NEGATIVE NEGATIVE   Ketones, ur 5 (A) NEGATIVE mg/dL   Protein, ur 100 (A) NEGATIVE mg/dL   Nitrite NEGATIVE NEGATIVE   Leukocytes,Ua TRACE (A) NEGATIVE   RBC / HPF 0-5 0 - 5 RBC/hpf   WBC, UA 11-20 0 - 5 WBC/hpf   Bacteria, UA MANY (A) NONE SEEN   Squamous Epithelial / LPF 6-10 0 - 5   Mucus PRESENT    Granular Casts, UA PRESENT    Non Squamous Epithelial 0-5 (A) NONE SEEN    Comment: Performed at [facility], [address] [place] [address], [place], [place] [address]  Basic metabolic panel     Status: Abnormal   Collection Time: 02/20/2018 10:00 PM  Result Value Ref Range   Sodium 134 (L) 135 - 145 mmol/L   Potassium 4.4 3.5 - 5.1 mmol/L    Comment: DELTA CHECK NOTED   Chloride 101 98 - 111 mmol/L   CO2 16 (L) 22 - 32 mmol/L   Glucose, Bld 303 (H) 70 - 99 mg/dL   BUN 44 (H) 8 - 23 mg/dL   Creatinine, Ser 2.20 (H) 0.44 - 1.00 mg/dL   Calcium 8.9 8.9 - 10.3 mg/dL   GFR calc non Af Amer 21 (L) >60 mL/min   GFR calc Af Amer 24 (L) >  60 mL/min   Anion gap 17 (H) 5 - 15    Comment: Performed at [facility], [address] [place], [place] [address]  Lactic acid, plasma     Status: Abnormal   Collection Time: 02/20/2018 10:01 PM  Result Value Ref Range   Lactic  Acid, Venous 7.3 (HH) 0.5 - 1.9 mmol/L    Comment: CRITICAL RESULT CALLED TO, READ BACK BY AND VERIFIED WITH: WATLINGTON,K @ 1829 ON 03/06/2018 BY JUW Performed at [facility], [address], [place], [place] [address]   CBG monitoring, ED     Status: Abnormal   Collection Time: 02/16/2018 10:04 PM  Result Value Ref Range   Glucose-Capillary 275 (H) 70 - 99 mg/dL   Comment 1 Notify RN    Comment 2 Document in Chart   Culture, blood (routine x 2)     Status: None (Preliminary result)   Collection Time: 03/09/2018 10:16 PM  Result Value Ref Range   Specimen Description LEFT ANTECUBITAL    Special Requests      BOTTLES DRAWN AEROBIC AND ANAEROBIC Blood Culture adequate volume   Culture      NO GROWTH < 12 HOURS Performed at [facility], [address], [place], [place] [address]    Report Status PENDING   CBG monitoring, ED     Status: Abnormal   Collection Time: 02/15/2018 11:28 PM  Result Value Ref Range   Glucose-Capillary 270 (H) 70 - 99 mg/dL  Lactic acid, plasma     Status: Abnormal   Collection Time: 02/25/2018 11:58 PM  Result Value Ref Range   Lactic Acid, Venous 7.4 (HH) 0.5 - 1.9 mmol/L    Comment: CRITICAL RESULT CALLED TO, READ BACK BY AND VERIFIED WITH: POINTDEXTER,M. AT 0040 ON 03/16/2018 BY EVA Performed at [facility], [address], [place], [place] [address]   Culture, blood (routine x 2)     Status: None (Preliminary result)   Collection Time: 03/14/2018 11:58 PM  Result Value Ref Range   Specimen Description LEFT ANTECUBITAL    Special Requests      BOTTLES DRAWN AEROBIC AND ANAEROBIC Blood Culture adequate volume   Culture      NO GROWTH < 12 HOURS Performed at [facility], [address] [place], [place] [address]    Report Status PENDING   CBG monitoring, ED     Status: Abnormal   Collection Time: 03/16/18 12:36 AM  Result Value Ref Range   Glucose-Capillary 213 (H) 70 - 99 mg/dL  CBG monitoring, ED     Status: Abnormal   Collection Time: 03/16/18  1:37 AM   Result Value Ref Range   Glucose-Capillary 185 (H) 70 - 99 mg/dL  CBG monitoring, ED     Status: Abnormal   Collection Time: 03/16/18  2:22 AM  Result Value Ref Range   Glucose-Capillary 169 (H) 70 - 99 mg/dL  Basic metabolic panel     Status: Abnormal   Collection Time: 03/16/18  2:26 AM  Result Value Ref Range   Sodium 136 135 - 145 mmol/L   Potassium 4.2 3.5 - 5.1 mmol/L   Chloride 105 98 - 111 mmol/L   CO2 16 (L) 22 - 32 mmol/L   Glucose, Bld 194 (H) 70 - 99 mg/dL   BUN 51 (H) 8 - 23 mg/dL   Creatinine, Ser 2.93 (H) 0.44 - 1.00 mg/dL   Calcium 8.2 (L) 8.9 - 10.3 mg/dL   GFR calc non Af Amer 15 (L) >60  mL/min   GFR calc Af Amer 17 (L) >60 mL/min   Anion gap 15 5 - 15    Comment: Performed at [facility], [address] [place], [place] [address]  CBC     Status: Abnormal   Collection Time: 03/16/18  2:39 AM  Result Value Ref Range   WBC 4.7 4.0 - 10.5 K/uL   RBC 2.89 (L) 3.87 - 5.11 MIL/uL   Hemoglobin 8.5 (L) 12.0 - 15.0 g/dL   HCT 27.6 (L) 36.0 - 46.0 %   MCV 95.5 80.0 - 100.0 fL   MCH 29.4 26.0 - 34.0 pg   MCHC 30.8 30.0 - 36.0 g/dL   RDW 13.5 11.5 - 15.5 %   Platelets 313 150 - 400 K/uL   nRBC 1.5 (H) 0.0 - 0.2 %    Comment: Performed at [facility], [address], [place], [place] [address]  Lactic acid, plasma     Status: Abnormal   Collection Time: 03/16/18  2:39 AM  Result Value Ref Range   Lactic Acid, Venous 7.3 (HH) 0.5 - 1.9 mmol/L    Comment: CRITICAL RESULT CALLED TO, READ BACK BY AND VERIFIED WITH: POINDEXTER,M @ 0301 ON 03/16/18 BY JUW Performed at [facility], [address], [place], [place] [address]   Blood gas, arterial     Status: Abnormal   Collection Time: 03/16/18  3:20 AM  Result Value Ref Range   FIO2 100.00    Delivery systems NON-REBREATHER OXYGEN MASK    pH, Arterial 7.350 7.350 - 7.450   pCO2 arterial 25.6 (L) 32.0 - 48.0 mmHg   pO2, Arterial 103 83.0 - 108.0 mmHg   Bicarbonate 16.0 (L) 20.0 - 28.0 mmol/L   Acid-base deficit 10.8  (H) 0.0 - 2.0 mmol/L   O2 Saturation 97.0 %   Patient temperature 34.4    Collection site RIGHT RADIAL    Allens test (pass/fail) PASS PASS    Comment: Performed at [facility], [address], [place], [place] [address]  CBG monitoring, ED     Status: Abnormal   Collection Time: 03/16/18  3:20 AM  Result Value Ref Range   Glucose-Capillary 212 (H) 70 - 99 mg/dL  Glucose, capillary     Status: Abnormal   Collection Time: 03/16/18  4:50 AM  Result Value Ref Range   Glucose-Capillary 147 (H) 70 - 99 mg/dL  Lactic acid, plasma     Status: Abnormal   Collection Time: 03/16/18  5:13 AM  Result Value Ref Range   Lactic Acid, Venous 6.4 (HH) 0.5 - 1.9 mmol/L    Comment: CRITICAL RESULT CALLED TO, READ BACK BY AND VERIFIED WITH: CUMMINGS B,RN 03/16/18 0973 WAYK Performed at [facility], [address], [place], [place] [address]   DIC (disseminated intravasc coag) panel     Status: Abnormal (Preliminary result)   Collection Time: 03/16/18  5:13 AM  Result Value Ref Range   Prothrombin Time 17.6 (H) 11.4 - 15.2 seconds   INR 1.5 (H) 0.8 - 1.2    Comment: (NOTE) INR goal varies based on device and disease states.    aPTT 35 24 - 36 seconds   Fibrinogen 354 210 - 475 mg/dL   D-Dimer, Quant PENDING 0.00 - 0.50 ug/mL-FEU   Platelets 322 150 - 400 K/uL    Comment: Performed at [facility], [place] [address], [place], [place] [address]   Smear Review PENDING   CBC     Status: Abnormal  Collection Time: 03/16/18  5:29 AM  Result Value Ref Range   WBC 7.6 4.0 - 10.5 K/uL   RBC 3.03 (L) 3.87 - 5.11 MIL/uL   Hemoglobin 9.1 (L) 12.0 - 15.0 g/dL   HCT 28.7 (L) 36.0 - 46.0 %   MCV 94.7 80.0 - 100.0 fL   MCH 30.0 26.0 - 34.0 pg   MCHC 31.7 30.0 - 36.0 g/dL   RDW 13.6 11.5 - 15.5 %   Platelets 343 150 - 400 K/uL   nRBC 1.3 (H) 0.0 - 0.2 %    Comment: Performed at [facility] [address], [place], [place] [address]  Type and screen [place]      Status: None (Preliminary result)   Collection Time: 03/16/18  5:34 AM  Result Value Ref Range   ABO/RH(D) PENDING    Antibody Screen PENDING    Sample Expiration      03/19/2018 Performed at [facility], [place] [address] [place], [place] [address]   Glucose, capillary     Status: None   Collection Time: 03/16/18  6:18 AM  Result Value Ref Range   Glucose-Capillary 96 70 - 99 mg/dL    Ct Abdomen Pelvis Wo Contrast  Result Date: 03/07/2018 CLINICAL DATA:  Left upper quadrant abdominal pain and nausea and vomiting that started today. EXAM: CT ABDOMEN AND PELVIS WITHOUT CONTRAST TECHNIQUE: Multidetector CT imaging of the abdomen and pelvis was performed following the standard protocol without IV contrast. COMPARISON:  CT scan and 08/21/2012 FINDINGS: Lower chest: Bibasilar scarring and atelectasis. No infiltrates or effusions. Heart is borderline enlarged. Three-vessel coronary artery calcifications are noted. Hepatobiliary: No focal hepatic lesions are identified without contrast. The gallbladder is markedly distended and contains high attenuation material worrisome for hemorrhage. This measures approximately 68 Hounsfield units and I think sludge is un likely. Vicarious excretion of contrast material is also possible if the patient has had a recent contrast enhanced scan some rales. Right upper quadrant ultrasound examination may be helpful for further evaluation. No intra or extrahepatic biliary dilatation. Pancreas: No mass, inflammation or ductal dilatation. Spleen: Normal size.  No focal lesions. Adrenals/Urinary Tract: The bladder is grossly normal. Stomach/Bowel: The stomach, duodenum, small bowel and colon are grossly normal. No acute inflammatory process, mass lesions or obstructive findings. The appendix is not identified for certain and may be surgically absent. Vascular/Lymphatic: Advanced atherosclerotic calcifications involving the aorta and iliac arteries and femoral arteries.  Left common iliac artery stent is noted. No mesenteric or retroperitoneal mass or adenopathy. Reproductive: Surgically absent. Other: No pelvic mass or adenopathy. No free pelvic fluid collections. No inguinal mass or adenopathy. No abdominal wall hernia or subcutaneous lesions. Musculoskeletal: No significant bony findings. IMPRESSION: 1. Distended gallbladder with high attenuation material highly suspicious for hemorrhagic cholecystitis. Recommend clinical correlation. Right upper quadrant ultrasound examination may be helpful for further evaluation. 2. Small bilateral adrenal gland adenomas. 3. Severe atherosclerotic calcifications involving the aorta and iliac arteries and branch vessels. There also three-vessel coronary artery calcifications. Electronically Signed   By: Marijo Sanes M.D.   On: 03/04/2018 20:22   Dg Abd Acute W/chest  Result Date: 03/13/2018 CLINICAL DATA:  Left upper quadrant pain, distention, and nausea and vomiting beginning today. Fever. EXAM: DG ABDOMEN ACUTE W/ 1V CHEST COMPARISON:  None. FINDINGS: There is no evidence of dilated bowel loops or free intraperitoneal air. No radiopaque calculi or other significant radiographic abnormality is seen. Peripheral vascular calcification noted. Low lung  volumes are noted, with mild bibasilar atelectasis. No evidence of pulmonary consolidation or edema. No evidence of pleural effusion. Heart size is normal. IMPRESSION: 1. Unremarkable bowel gas pattern. 2. Low lung volumes with mild bibasilar atelectasis. Electronically Signed   By: Earle Gell M.D.   On: 02/16/2018 18:10    Review of Systems  Unable to perform ROS: Other   Blood pressure (!) 94/54, pulse (!) 105, temperature (!) 102.4 F (39.1 C), resp. rate (!) 29, height 5\' 6"  (1.676 m), weight 77.4 kg, SpO2 94 %. Physical Exam  Constitutional: She appears well-developed and well-nourished.  HENT:  Head: Normocephalic.  Right Ear: External ear normal.  Left Ear: External ear  normal.  BiPAP  Eyes: Pupils are equal, round, and reactive to light.  Neck: No tracheal deviation present. No thyromegaly present.  Cardiovascular:  HR 105, diminished distal pulses  Respiratory:  RR 30 on BiPAP, few rhonchi  GI: Soft. She exhibits no distension. There is abdominal tenderness. There is guarding. There is no rebound.  Tenderness and voluntary guarding RUQ  Neurological:  Follows commands but speech hard to understand with BiPAP  Skin: Skin is warm.    Assessment/Plan: Acute cholecystitis with sepsis - also has significant transaminitis and mild elevation of bilirubin.  Agree with n.p.o., IV antibiotics.  I also agree with the plan for percutaneous cholecystostomy tube placement by interventional radiology.  I discussed this at the bedside with Dr. Vernard Gambles who will proceed this morning. We will follow.  Zenovia Jarred 03/16/2018, 6:25 AM

## 2018-03-16 NOTE — Progress Notes (Signed)
Asked to evaluate patient for potential CRRT  Labs   PH  6.957    Bicarbonate 5.4   PCO2 25  Sodium 135   Potassium  4.5  Chloride 109   Creatinine 3.58   Ca  7.3   BUN  48   Glc 72   Wbc 10.2   Hb 8.8  Plt 287    X Ray   Multifocal patchy right upper lobe opacities   Discussed with Critical Care Service   Will proceed with CRRT   They will place dialysis catheter

## 2018-03-16 NOTE — ED Notes (Signed)
CRITICAL VALUE ALERT  Critical Value:  Lactic Acid  Date & Time Notied:  03/16/2018 0042  Provider Notified: Silas Sacramento, MD via amion  Orders Received/Actions taken: n/a

## 2018-03-16 NOTE — Procedures (Signed)
Hemodialysis Catheter Insertion Procedure Note Caitlin Yates [id number] [birth year]  Procedure: Insertion of Hemodialysis Catheter Indications: Dialysis Access   Procedure Details Consent: Unable to obtain consent because of emergent medical necessity. Time Out: Verified patient identification, verified procedure, site/side was marked, verified correct patient position, special equipment/implants available, medications/allergies/relevent history reviewed, required imaging and test results available.  Performed  Maximum sterile technique was used including antiseptics, cap, gloves, gown, hand hygiene, mask and sheet. Skin prep: Chlorhexidine; local anesthetic administered Triple lumen hemodialysis catheter was inserted into right internal jugular vein using the Seldinger technique.  Evaluation Blood flow good Complications: No apparent complications Patient did tolerate procedure well. Chest X-ray ordered to verify placement.  CXR: pending.  Hayden Pedro, AGACNP-BC [facility]  PCCM Pgr: [phone]

## 2018-03-16 NOTE — ED Notes (Signed)
[facility] in ED and was aware of pt needing to be transferred to [facility]. This Teaching laboratory technician made [facility] lead Man[place], RN of pt's declining status and the only orders that were placed by Dr. Shanon Brow. Cindy on phone with her supervisor waiting for bed placement and orders from [facility]

## 2018-03-16 NOTE — Progress Notes (Signed)
No patient removal order placed. Spoke with Dr. Justin Mend with nephrology. Verbal to keep patient even throughout the night.

## 2018-03-16 NOTE — ED Notes (Signed)
Ice packs placed on pt in place of cooling blanket

## 2018-03-16 NOTE — ED Notes (Signed)
Dr. Shanon Brow hasn't come to ED to evaluate pt

## 2018-03-16 NOTE — Progress Notes (Signed)
Brief Progress Note  Acute hemorrhagic cholecystitis, Acute liver injury, septic shock:  Acute hypoxic respiratory failure Metabolic acidosis with anion gap, lactic acidosis, DKA AKI DKA/DM HTN S:  Unable, On BiPAP, tiring O:  Temp 101.1 Hemodynamically unstable  On 2 pressors with MAP of 60 through peripheral lines Limited IV access Wearing out on BiPAP LFT's  AST: 8024 ALT : 3038 Alk Phos 237 Creatinine 3.58 and uptrending A: Will need intubation and mechanical ventilation as she is tiring Family have decided, after considerable debate,  to intubate patient P: Will Intubate to full MV support Titrate and wean FiO2 and PEEP as able ABG 1 hour after intubation and in am Trend Lactate Will place central line for vasopressor administration, lab draws  and fluids Start Gioprezza , wean epi first and levo as able Start Vasopressin per Dr. Valeta Harms Echo Place OG tube for medication administration and nutrition Renal Consult  Trend BMET  Maintain renal perfusion Avoid nephrotoxic medications Will need ongoing goals of care discussions Follow Cultures Trend fever and WBC Hold all home antihypertensives   Magdalen Spatz, AGACNP-BC [place] Pager # [phone] After 4 pm please call [phone] 03/16/2018 3:58 PM

## 2018-03-16 NOTE — ED Notes (Signed)
Messaged Dr. Derrill Kay on Epic secure messenger about pt's status. Currently not read.

## 2018-03-16 NOTE — ED Notes (Signed)
Dr. Shanon Brow called about page this RN sent. This RN told Dr. Shanon Brow about the pt's decompensating VS and LOC. Including low BP, switching pt from 4l/min via N.[place]. to 15L NRB, and decreasing LOC. Dr. Shanon Brow ordered 2 L NS bolus

## 2018-03-16 NOTE — Consult Note (Addendum)
Chief Complaint: Patient was seen in consultation today for  Chief Complaint  Patient presents with  . Abdominal Pain   at the request of Dr Shanon Brow  Referring Physician(s): Dr Shanon Brow  Supervising Physician: Arne Cleveland  Patient Status: [facility] - In-pt  History of Present Illness: Caitlin Yates is a 78 y.o. female presented to [facility] with abdominal pain. CT suggested hemorrhagic cholecystitis. She  began to worsen clinically including hypotension and lactic acidosis.  She was transferred to the critical care medicine service at [facility] with plan for apercutaneous cholecystostomy tube.  She is currently on BiPAP and cannot participate much with history.  Past Medical History:  Diagnosis Date  . Arthritis   . Diabetes mellitus without complication ([place])   . GERD (gastroesophageal reflux disease)   . Hypercholesterolemia   . Hypertension     Past Surgical History:  Procedure Laterality Date  . ABDOMINAL HYSTERECTOMY    . vascular stent right leg      Allergies: Lisinopril and Statins  Medications: Prior to Admission medications   Medication Sig Start Date End Date Taking? Authorizing Provider  amLODipine (NORVASC) 10 MG tablet TAKE 1 TABLET BY MOUTH ONCE DAILY. Patient taking differently: Take 10 mg by mouth every morning.  05/06/17  Yes Branch, Alphonse Guild, MD  aspirin EC 81 MG tablet Take 81 mg by mouth every morning.   Yes [provider]  atenolol-chlorthalidone (TENORETIC) 50-25 MG per tablet Take 1 tablet by mouth every morning.   Yes [provider]  atorvastatin (LIPITOR) 10 MG tablet Take 10 mg by mouth at bedtime.    Yes [provider]  clopidogrel (PLAVIX) 75 MG tablet Take 75 mg by mouth every morning.   Yes [provider]  HYDROcodone-acetaminophen (NORCO/VICODIN) 5-325 MG tablet Take 1 tablet by mouth every 6 (six) hours as needed for moderate pain.   Yes [provider]  predniSONE (DELTASONE) 20 MG  tablet Take 20 mg by mouth daily with breakfast. 5 day course   Yes [provider]  cyclobenzaprine (FLEXERIL) 10 MG tablet Take 10 mg by mouth 3 (three) times daily as needed for muscle spasms.    [provider]  DEXILANT 60 MG capsule Take 60 mg by mouth daily.  07/16/14   [provider]  gabapentin (NEURONTIN) 300 MG capsule Take 300 mg by mouth 3 (three) times daily.    [provider]  glimepiride (AMARYL) 4 MG tablet Take 4 mg by mouth daily with breakfast.  02/04/15   [provider]  Multiple Vitamins-Minerals (CENTURY MATURE ADULT FORMULA PO) Take 1 tablet by mouth every morning.    [provider]  naproxen (NAPROSYN) 500 MG tablet Take 500 mg by mouth every 12 (twelve) hours as needed.  09/11/16   [provider]  potassium chloride (K-DUR) 10 MEQ tablet Take 1 tablet (10 mEq total) by mouth daily. 12/28/16   Milton Ferguson, MD  Semaglutide,0.25 or 0.5MG /DOS, (OZEMPIC, 0.25 OR 0.5 MG/DOSE,) 2 MG/1.5ML SOPN Inject into the skin.    [provider]  sitaGLIPtan-metformin (JANUMET) 50-1000 MG per tablet Take 1 tablet by mouth 2 (two) times daily.    [provider]  spironolactone (ALDACTONE) 25 MG tablet Take 25 mg by mouth daily.    [provider]  spironolactone (ALDACTONE) 25 MG tablet TAKE (1/2) TABLET BY MOUTH ONCE DAILY. 09/04/17   Arnoldo Lenis, MD  Vitamin D, Ergocalciferol, (DRISDOL) 50000 units CAPS capsule Take 50,000 Units by  mouth every Wednesday.    [provider]     Family History  Problem Relation Age of Onset  . CVA Mother   . Hypertension Mother   . Heart attack Brother   . Colon cancer Neg Hx     Social History   Socioeconomic History  . Marital status: Legally Separated    Spouse name: Not on file  . Number of children: Not on file  . Years of education: Not on file  . Highest education level: Not on file  Occupational History  . Not on file  Social  Needs  . Financial resource strain: Not on file  . Food insecurity:    Worry: Not on file    Inability: Not on file  . Transportation needs:    Medical: Not on file    Non-medical: Not on file  Tobacco Use  . Smoking status: Never Smoker  . Smokeless tobacco: Never Used  Substance and Sexual Activity  . Alcohol use: No  . Drug use: No  . Sexual activity: Not on file  Lifestyle  . Physical activity:    Days per week: Not on file    Minutes per session: Not on file  . Stress: Not on file  Relationships  . Social connections:    Talks on phone: Not on file    Gets together: Not on file    Attends religious service: Not on file    Active member of club or organization: Not on file    Attends meetings of clubs or organizations: Not on file    Relationship status: Not on file  Other Topics Concern  . Not on file  Social History Narrative  . Not on file    ECOG Status: 2 - Symptomatic, <50% confined to bed    . Vital Signs: BP (!) 94/54   Pulse 95   Temp (!) 102.4 F (39.1 C)   Resp (!) 35   Ht 5\' 6"  (1.676 m)   Wt 77.4 kg   SpO2 100%   BMI 27.54 kg/m  Constitutional: Oriented to person, place, and time. Well-developed and well-nourished. No distress.  Last Weight  Most recent update: 03/16/2018  4:49 AM   Weight  77.4 kg (170 lb 10.2 oz)           HENT:  Head: Normocephalic and atraumatic.  Eyes: Conjunctivae and EOM are normal. Right eye exhibits no discharge. Left eye exhibits no discharge. No scleral icterus.  Neck: No JVD present.  Pulmonary/Chest:On BiPap. No stridor.  Abdomen: soft, obese, non distended, tender RUQ Neurological:  Limited; alert and oriented to person, place, and time.  Skin: Skin is warm and dry.  not diaphoretic.  Psychiatric:   normal mood and affect.   behavior is normal.    Review of Systems: A 12 point ROS discussed and pertinent positives are indicated in the HPI above.  All other systems are negative.   Imaging: Ct Abdomen  Pelvis Wo Contrast  Result Date: 03/12/2018 CLINICAL DATA:  Left upper quadrant abdominal pain and nausea and vomiting that started today. EXAM: CT ABDOMEN AND PELVIS WITHOUT CONTRAST TECHNIQUE: Multidetector CT imaging of the abdomen and pelvis was performed following the standard protocol without IV contrast. COMPARISON:  CT scan and 08/21/2012 FINDINGS: Lower chest: Bibasilar scarring and atelectasis. No infiltrates or effusions. Heart is borderline enlarged. Three-vessel coronary artery calcifications are noted. Hepatobiliary: No focal hepatic lesions are identified without contrast. The gallbladder is markedly distended and contains high  attenuation material worrisome for hemorrhage. This measures approximately 68 Hounsfield units and I think sludge is un likely. Vicarious excretion of contrast material is also possible if the patient has had a recent contrast enhanced scan some rales. Right upper quadrant ultrasound examination may be helpful for further evaluation. No intra or extrahepatic biliary dilatation. Pancreas: No mass, inflammation or ductal dilatation. Spleen: Normal size.  No focal lesions. Adrenals/Urinary Tract: The bladder is grossly normal. Stomach/Bowel: The stomach, duodenum, small bowel and colon are grossly normal. No acute inflammatory process, mass lesions or obstructive findings. The appendix is not identified for certain and may be surgically absent. Vascular/Lymphatic: Advanced atherosclerotic calcifications involving the aorta and iliac arteries and femoral arteries. Left common iliac artery stent is noted. No mesenteric or retroperitoneal mass or adenopathy. Reproductive: Surgically absent. Other: No pelvic mass or adenopathy. No free pelvic fluid collections. No inguinal mass or adenopathy. No abdominal wall hernia or subcutaneous lesions. Musculoskeletal: No significant bony findings. IMPRESSION: 1. Distended gallbladder with high attenuation material highly suspicious for  hemorrhagic cholecystitis. Recommend clinical correlation. Right upper quadrant ultrasound examination may be helpful for further evaluation. 2. Small bilateral adrenal gland adenomas. 3. Severe atherosclerotic calcifications involving the aorta and iliac arteries and branch vessels. There also three-vessel coronary artery calcifications. Electronically Signed   By: Marijo Sanes M.D.   On: 03/09/2018 20:22   Dg Abd Acute W/chest  Result Date: 03/09/2018 CLINICAL DATA:  Left upper quadrant pain, distention, and nausea and vomiting beginning today. Fever. EXAM: DG ABDOMEN ACUTE W/ 1V CHEST COMPARISON:  None. FINDINGS: There is no evidence of dilated bowel loops or free intraperitoneal air. No radiopaque calculi or other significant radiographic abnormality is seen. Peripheral vascular calcification noted. Low lung volumes are noted, with mild bibasilar atelectasis. No evidence of pulmonary consolidation or edema. No evidence of pleural effusion. Heart size is normal. IMPRESSION: 1. Unremarkable bowel gas pattern. 2. Low lung volumes with mild bibasilar atelectasis. Electronically Signed   By: Earle Gell M.D.   On: 03/09/2018 18:10    Labs:  CBC: Recent Labs    02/25/2018 1704 03/16/18 0239 03/16/18 0513 03/16/18 0529  WBC 1.0* 4.7  --  7.6  HGB 10.7* 8.5*  --  9.1*  HCT 33.6* 27.6*  --  28.7*  PLT 404* 313 322 343    COAGS: Recent Labs    02/21/2018 1704 03/16/18 0513  INR 1.1 1.5*  APTT  --  35    BMP: Recent Labs    03/10/2018 1704 02/28/2018 2200 03/16/18 0226 03/16/18 0529  NA 136 134* 136 138  K 3.4* 4.4 4.2 3.7  CL 99 101 105 109  CO2 20* 16* 16* 16*  GLUCOSE 325* 303* 194* 148*  BUN 35* 44* 51* 42*  CALCIUM 9.9 8.9 8.2* 7.7*  CREATININE 1.64* 2.20* 2.93* 3.15*  GFRNONAA 29* 21* 15* 13*  GFRAA 34* 24* 17* 15*    LIVER FUNCTION TESTS: Recent Labs    03/12/2018 1704 03/16/18 0529  BILITOT 1.4* 1.7*  AST 4,149* PENDING  ALT 1,913* PENDING  ALKPHOS 285* 237*  PROT  7.1 5.4*  ALBUMIN 3.8 2.6*    TUMOR MARKERS: No results for input(s): AFPTM, CEA, CA199, CHROMGRNA in the last 8760 hours.  Assessment and Plan:  My impression is that this patients sepsis is likely secondary to hemorrhagic cholecystitis. She is an appropriate candidate for percutaneous cholecystsostomy placement. I reviewed the clinical situation with the patient. Reviewed the procedure, technique, anticipated benefits, possible risks, and alternatives.  She seems to understands and had all questions answered. She is agreeable to proceed. We will proceed urgently, with moderate sedation as needed.  Thank you for this interesting consult.  I greatly enjoyed meeting Caitlin Yates and look forward to participating in their care.  A copy of this report was sent to the requesting provider on this date.  Electronically Signed: Rickard Rhymes, MD 03/16/2018, 7:24 AM   I spent a total of 20 Minutes    in face to face in clinical consultation, greater than 50% of which was counseling/coordinating care for cholecystitis.

## 2018-03-16 NOTE — ED Notes (Addendum)
This RN placed pt on NRB due to decreasing O2 % on 4L via [place] RT called and coming to down assess

## 2018-03-16 NOTE — Progress Notes (Signed)
CRITICAL VALUE ALERT  Critical Value:  Lactic acid 6.4   Date & Time Notied:  03/16/18 6:15 AM   Provider Notified: Dr. Randell Patient  Orders Received/Actions taken: acknowledged

## 2018-03-16 NOTE — Consult Note (Signed)
Reason for Consult: AKI/CKD stage 3 Referring Physician: Valeta Harms, MD  Caitlin Yates is an 78 y.o. female.  HPI: Caitlin Yates has a PMH significant for HTN, DM, HLD, DJD, GERD, and CKD stage 3 who presented to [facility] ED with acute abdominal pain on 02/19/2018.  She was found to be hypotensive with marked elevation of her LFT's.  She was transferred to [facility] ICU after she was diagnosed with acute cholecystitis and respiratory distress.  She was subsequently intubated and underwent cholecystostomy tube placement for hemorrhagic cholecystitis.  She was started on empiric antibiotics and levophed for maintenance of BP.  Her course has been further complicated by the development of oliguric ARF.  The trend in Scr is seen below.  We were consulted to further evaluate and manage her ARF and electrolyte abnormalities.   Of note, she had been on aldactone and metformin prior to hospitalization.   Trend in Creatinine: Creatinine, Ser  Date/Time Value Ref Range Status  03/16/2018 11:57 AM 3.58 (H) 0.44 - 1.00 mg/dL Final  03/16/2018 05:29 AM 3.15 (H) 0.44 - 1.00 mg/dL Final  03/16/2018 02:26 AM 2.93 (H) 0.44 - 1.00 mg/dL Final  03/10/2018 10:00 PM 2.20 (H) 0.44 - 1.00 mg/dL Final  02/22/2018 05:04 PM 1.64 (H) 0.44 - 1.00 mg/dL Final  12/28/2016 04:19 PM 1.46 (H) 0.44 - 1.00 mg/dL Final  12/05/2016 11:30 AM 1.34 (H) 0.44 - 1.00 mg/dL Final  09/18/2016 03:56 AM 1.09 (H) 0.44 - 1.00 mg/dL Final  09/17/2016 03:13 AM 1.91 (H) 0.44 - 1.00 mg/dL Final  09/16/2016 09:26 PM 2.12 (H) 0.44 - 1.00 mg/dL Final  04/08/2015 05:21 PM 1.16 (H) 0.44 - 1.00 mg/dL Final  03/09/2015 10:06 AM 1.20 (H) 0.60 - 0.93 mg/dL Final  08/19/2012 03:44 PM 1.30 (H) 0.50 - 1.10 mg/dL Final  08/18/2012 05:58 PM 1.09 0.50 - 1.10 mg/dL Final    PMH:   Past Medical History:  Diagnosis Date  . Arthritis   . Diabetes mellitus without complication ([place])   . GERD (gastroesophageal reflux disease)   . Hypercholesterolemia   . Hypertension      PSH:   Past Surgical History:  Procedure Laterality Date  . ABDOMINAL HYSTERECTOMY    . IR PERC CHOLECYSTOSTOMY  03/16/2018  . vascular stent right leg      Allergies:  Allergies  Allergen Reactions  . Lisinopril Swelling    Tongue swelling  . Statins     Muscle aches    Medications:   Prior to Admission medications   Medication Sig Start Date End Date Taking? Authorizing Provider  amLODipine (NORVASC) 10 MG tablet TAKE 1 TABLET BY MOUTH ONCE DAILY. Patient taking differently: Take 10 mg by mouth every morning.  05/06/17  Yes Branch, Alphonse Guild, MD  aspirin EC 81 MG tablet Take 81 mg by mouth every morning.   Yes [provider]  atenolol-chlorthalidone (TENORETIC) 50-25 MG per tablet Take 1 tablet by mouth every morning.   Yes [provider]  atorvastatin (LIPITOR) 10 MG tablet Take 10 mg by mouth at bedtime.    Yes [provider]  clopidogrel (PLAVIX) 75 MG tablet Take 75 mg by mouth every morning.   Yes [provider]  HYDROcodone-acetaminophen (NORCO/VICODIN) 5-325 MG tablet Take 1 tablet by mouth every 6 (six) hours as needed for moderate pain.   Yes [provider]  predniSONE (DELTASONE) 20 MG tablet Take 20 mg by mouth daily with breakfast. 5 day course   Yes [provider]  cyclobenzaprine (FLEXERIL) 10 MG tablet Take 10 mg by mouth 3 (three) times daily as needed for muscle spasms.    [provider]  DEXILANT 60 MG capsule Take 60 mg by mouth daily.  07/16/14   [provider]  gabapentin (NEURONTIN) 300 MG capsule Take 300 mg by mouth 3 (three) times daily.    [provider]  glimepiride (AMARYL) 4 MG tablet Take 4 mg by mouth daily with breakfast.  02/04/15   [provider]  Multiple Vitamins-Minerals (CENTURY MATURE ADULT FORMULA PO) Take 1 tablet by mouth every morning.    [provider]  naproxen (NAPROSYN) 500 MG tablet Take 500 mg by mouth every 12 (twelve)  hours as needed.  09/11/16   [provider]  potassium chloride (K-DUR) 10 MEQ tablet Take 1 tablet (10 mEq total) by mouth daily. 12/28/16   Milton Ferguson, MD  Semaglutide,0.25 or 0.5MG /DOS, (OZEMPIC, 0.25 OR 0.5 MG/DOSE,) 2 MG/1.5ML SOPN Inject into the skin.    [provider]  sitaGLIPtan-metformin (JANUMET) 50-1000 MG per tablet Take 1 tablet by mouth 2 (two) times daily.    [provider]  spironolactone (ALDACTONE) 25 MG tablet Take 25 mg by mouth daily.    [provider]  spironolactone (ALDACTONE) 25 MG tablet TAKE (1/2) TABLET BY MOUTH ONCE DAILY. 09/04/17   Arnoldo Lenis, MD  Vitamin D, Ergocalciferol, (DRISDOL) 50000 units CAPS capsule Take 50,000 Units by mouth every Wednesday.    [provider]    Inpatient medications: . chlorhexidine  15 mL Mouth Rinse BID  . fentaNYL      . fentaNYL (SUBLIMAZE) injection  50 mcg Intravenous Once  . lidocaine      . mouth rinse  15 mL Mouth Rinse q12n4p  . midazolam      . sodium chloride flush  3 mL Intravenous Once  . sodium chloride flush  5 mL Intracatheter Q8H    Discontinued Meds:   Medications Discontinued During This Encounter  Medication Reason  . predniSONE (STERAPRED UNI-PAK 21 TAB) 5 MG (21) TBPK tablet Completed Course  . acetaminophen (TYLENOL) 500 MG tablet Completed Course  . 0.9 % NaCl with KCl 40 mEq / L  infusion   . famotidine (PEPCID) IVPB 20 mg premix   . 0.9 %  sodium chloride infusion   . bisacodyl (DULCOLAX) suppository 10 mg   . morphine 2 MG/ML injection 2 mg   . insulin aspart (novoLOG) injection 0-15 Units   . 0.9 %  sodium chloride infusion   . 0.9 %  sodium chloride infusion   . famotidine (PEPCID) IVPB 20 mg premix   . insulin glargine (LANTUS) injection 20 Units   . insulin regular, human (MYXREDLIN) 100 units/ 100 mL infusion   . cefTRIAXone (ROCEPHIN) 1 g in sodium chloride 0.9 % 100 mL IVPB   . cefTRIAXone (ROCEPHIN) 2 g in sodium chloride 0.9  % 100 mL IVPB     Social History:  reports that she has never smoked. She has never used smokeless tobacco. She reports that she does not drink alcohol or use drugs.  Family History:   Family History  Problem Relation Age of Onset  . CVA Mother   . Hypertension Mother   . Heart attack Brother   . Colon cancer Neg Hx     Review of systems not obtained due to patient factors. Weight change:   Intake/Output Summary (Last 24 hours) at 03/16/2018 1524 Last data filed at 03/16/2018  1300 Gross per 24 hour  Intake 4144.84 ml  Output 106 ml  Net 4038.84 ml   BP (!) 78/42   Pulse 66   Temp 99 F (37.2 C)   Resp (!) 28   Ht 5\' 6"  (1.676 m)   Wt 77.4 kg   SpO2 100%   BMI 27.54 kg/m  Vitals:   03/16/18 1245 03/16/18 1300 03/16/18 1315 03/16/18 1451  BP: (!) 80/59 (!) 78/65 (!) 79/67 (!) 78/42  Pulse: 93 86 79 66  Resp: (!) 32 (!) 25 (!) 30 (!) 28  Temp: 99 F (37.2 C) 99 F (37.2 C) 99 F (37.2 C)   TempSrc:      SpO2: 100% 98% 100% 100%  Weight:      Height:         General appearance: intubated and sedated Head: Normocephalic, without obvious abnormality, atraumatic Resp: clear to auscultation bilaterally Cardio: regular rate and rhythm and no rub GI: cholecystostomy tube in RUQ, +BS, soft Extremities: extremities normal, atraumatic, no cyanosis or edema  Labs: Basic Metabolic Panel: Recent Labs  Lab 02/28/2018 1704 03/07/2018 2200 03/16/18 0226 03/16/18 0529 03/16/18 1157  NA 136 134* 136 138 135  K 3.4* 4.4 4.2 3.7 4.5  CL 99 101 105 109 109  CO2 20* 16* 16* 16* 12*  GLUCOSE 325* 303* 194* 148* 72  BUN 35* 44* 51* 42* 48*  CREATININE 1.64* 2.20* 2.93* 3.15* 3.58*  ALBUMIN 3.8  --   --  2.6*  --   CALCIUM 9.9 8.9 8.2* 7.7* 7.3*   Liver Function Tests: Recent Labs  Lab 03/02/2018 1704 03/16/18 0529  AST 4,149* 8,024*  ALT 1,913* 3,038*  ALKPHOS 285* 237*  BILITOT 1.4* 1.7*  PROT 7.1 5.4*  ALBUMIN 3.8 2.6*   Recent Labs  Lab 03/10/2018 1704  LIPASE 69*    No results for input(s): AMMONIA in the last 168 hours. CBC: Recent Labs  Lab 03/03/2018 1704 03/16/18 0239 03/16/18 0513 03/16/18 0529 03/16/18 1157  WBC 1.0* 4.7  --  7.6 10.2  NEUTROABS 0.7*  --   --   --   --   HGB 10.7* 8.5*  --  9.1* 8.8*  HCT 33.6* 27.6*  --  28.7* 28.0*  MCV 92.6 95.5  --  94.7 94.6  PLT 404* 313 322 343 287   PT/INR: @LABRCNTIP (inr:5) Cardiac Enzymes: ) Recent Labs  Lab 03/02/2018 1704  TROPONINI <0.03   CBG: Recent Labs  Lab 03/16/18 0816 03/16/18 0838 03/16/18 0954 03/16/18 1057 03/16/18 1218  GLUCAP 70 91 72 72 60*    Iron Studies: No results for input(s): IRON, TIBC, TRANSFERRIN, FERRITIN in the last 168 hours.  Xrays/Other Studies: Ct Abdomen Pelvis Wo Contrast  Result Date: 02/28/2018 CLINICAL DATA:  Left upper quadrant abdominal pain and nausea and vomiting that started today. EXAM: CT ABDOMEN AND PELVIS WITHOUT CONTRAST TECHNIQUE: Multidetector CT imaging of the abdomen and pelvis was performed following the standard protocol without IV contrast. COMPARISON:  CT scan and 08/21/2012 FINDINGS: Lower chest: Bibasilar scarring and atelectasis. No infiltrates or effusions. Heart is borderline enlarged. Three-vessel coronary artery calcifications are noted. Hepatobiliary: No focal hepatic lesions are identified without contrast. The gallbladder is markedly distended and contains high attenuation material worrisome for hemorrhage. This measures approximately 68 Hounsfield units and I think sludge is un likely. Vicarious excretion of contrast material is also possible if the patient has had a recent contrast enhanced scan some rales. Right upper quadrant ultrasound examination may be  helpful for further evaluation. No intra or extrahepatic biliary dilatation. Pancreas: No mass, inflammation or ductal dilatation. Spleen: Normal size.  No focal lesions. Adrenals/Urinary Tract: The bladder is grossly normal. Stomach/Bowel: The stomach, duodenum, small  bowel and colon are grossly normal. No acute inflammatory process, mass lesions or obstructive findings. The appendix is not identified for certain and may be surgically absent. Vascular/Lymphatic: Advanced atherosclerotic calcifications involving the aorta and iliac arteries and femoral arteries. Left common iliac artery stent is noted. No mesenteric or retroperitoneal mass or adenopathy. Reproductive: Surgically absent. Other: No pelvic mass or adenopathy. No free pelvic fluid collections. No inguinal mass or adenopathy. No abdominal wall hernia or subcutaneous lesions. Musculoskeletal: No significant bony findings. IMPRESSION: 1. Distended gallbladder with high attenuation material highly suspicious for hemorrhagic cholecystitis. Recommend clinical correlation. Right upper quadrant ultrasound examination may be helpful for further evaluation. 2. Small bilateral adrenal gland adenomas. 3. Severe atherosclerotic calcifications involving the aorta and iliac arteries and branch vessels. There also three-vessel coronary artery calcifications. Electronically Signed   By: Marijo Sanes M.D.   On: 03/13/2018 20:22   Ir Perc Cholecystostomy  Result Date: 03/16/2018 CLINICAL DATA:  Acute hemorrhagic cholecystitis, sepsis EXAM: PERCUTANEOUS CHOLECYSTOSTOMY TUBE PLACEMENT WITH ULTRASOUND AND FLUOROSCOPIC GUIDANCE FLUOROSCOPY TIME:  90 seconds; 11 mGy TECHNIQUE: The procedure, risks (including but not limited to bleeding, infection, organ damage ), benefits, and alternatives were explained to the patient. Questions regarding the procedure were encouraged and answered. The patient understands and consents to the procedure. Survey ultrasound of the abdomen was performed and an appropriate skin entry site was identified. Skin site was marked, prepped with chlorhexidine, and draped in usual sterile fashion, and infiltrated locally with 1% lidocaine. Intravenous Fentanyl and Versed were administered as conscious sedation during  continuous monitoring of the patient's level of consciousness and physiological / cardiorespiratory status by the radiology RN, with a total moderate sedation time of 12 minutes. Under real-time ultrasound guidance, gallbladder was accessed using a transhepatic approach with a 21-gauge needle. Ultrasound image documentation was saved. Bile returned through the hub. Needle was exchanged over a 018 guidewire for transitional dilator which allowed placement of 035 J wire. Over this, a 10.2 French pigtail catheter was advanced and formed centrally in the gallbladder lumen. 20 mL of blackish green bile were aspirated, sent for culture. Small contrast injection confirmed appropriate position. Catheter secured externally with 0 Prolene suture and placed external drain bag. Patient tolerated the procedure well. COMPLICATIONS: COMPLICATIONS none IMPRESSION: 1. Technically successful percutaneous cholecystostomy tube placement with ultrasound and fluoroscopic guidance. Electronically Signed   By: Lucrezia Europe M.D.   On: 03/16/2018 08:02   Dg Abd Acute W/chest  Result Date: 02/17/2018 CLINICAL DATA:  Left upper quadrant pain, distention, and nausea and vomiting beginning today. Fever. EXAM: DG ABDOMEN ACUTE W/ 1V CHEST COMPARISON:  None. FINDINGS: There is no evidence of dilated bowel loops or free intraperitoneal air. No radiopaque calculi or other significant radiographic abnormality is seen. Peripheral vascular calcification noted. Low lung volumes are noted, with mild bibasilar atelectasis. No evidence of pulmonary consolidation or edema. No evidence of pleural effusion. Heart size is normal. IMPRESSION: 1. Unremarkable bowel gas pattern. 2. Low lung volumes with mild bibasilar atelectasis. Electronically Signed   By: Earle Gell M.D.   On: 02/15/2018 18:10     Assessment/Plan: 1.  AKI/CKD stage 3 presumably due to ischemic ATN in setting of septic shock and ongoing hypotension and need for pressors.  She has been  oliguric  since admission and remains hypotensive despite pressors.  There is not urgent indication to initiate CVVHD at this time, however she is not stable enough even for CVVHD with MAP of only 40's.  Will continue to follow but will need improved hemodynamics to even consider initiation of CVVHD.  Case discussed with Dr. Valeta Harms who is place an art line and central line for pressors and fluids. 2. Hemorrhagic cholecystitis- s/p cholecystostomy tube.  Surgery following 3. Septic shock- as above 4. VDRF- per PCCM 5. Metabolic acidosis-  Due to sepsis and metformin.  Start isotonic bicarb and follow 6. ABLA- transfuse prn 7. Disposition- overall prognosis is poor.  Continue with current level of medical care for now per family wishes.   Governor Rooks Aseel [place] 03/16/2018, 3:24 PM

## 2018-03-16 NOTE — ED Notes (Addendum)
Amion paged Dr. Derrill Kay about pt's hypotension and asking for her to come see pt

## 2018-03-16 NOTE — ED Notes (Signed)
Dr. Shanon Brow hasn't showed up to evaluate pt

## 2018-03-16 DEATH — deceased

## 2018-03-17 ENCOUNTER — Inpatient Hospital Stay (HOSPITAL_COMMUNITY): Payer: Medicare Other

## 2018-03-17 DIAGNOSIS — R6521 Severe sepsis with septic shock: Secondary | ICD-10-CM

## 2018-03-17 LAB — RENAL FUNCTION PANEL
Albumin: 2.3 g/dL — ABNORMAL LOW (ref 3.5–5.0)
Albumin: 2.6 g/dL — ABNORMAL LOW (ref 3.5–5.0)
Anion gap: 31 — ABNORMAL HIGH (ref 5–15)
Anion gap: 32 — ABNORMAL HIGH (ref 5–15)
BUN: 32 mg/dL — ABNORMAL HIGH (ref 8–23)
BUN: 22 mg/dL (ref 8–23)
CHLORIDE: 89 mmol/L — AB (ref 98–111)
CO2: 11 mmol/L — AB (ref 22–32)
CO2: 16 mmol/L — AB (ref 22–32)
Calcium: 6.4 mg/dL — CL (ref 8.9–10.3)
Calcium: 6.1 mg/dL — CL (ref 8.9–10.3)
Chloride: 97 mmol/L — ABNORMAL LOW (ref 98–111)
Creatinine, Ser: 2.99 mg/dL — ABNORMAL HIGH (ref 0.44–1.00)
Creatinine, Ser: 2.45 mg/dL — ABNORMAL HIGH (ref 0.44–1.00)
GFR calc Af Amer: 17 mL/min — ABNORMAL LOW (ref >60)
GFR calc Af Amer: 21 mL/min — ABNORMAL LOW (ref >60)
GFR calc non Af Amer: 14 mL/min — ABNORMAL LOW (ref >60)
GFR calc non Af Amer: 18 mL/min — ABNORMAL LOW (ref >60)
Glucose, Bld: 89 mg/dL (ref 70–99)
Glucose, Bld: 98 mg/dL (ref 70–99)
Phosphorus: 6.6 mg/dL — ABNORMAL HIGH (ref 2.5–4.6)
Phosphorus: 5.2 mg/dL — ABNORMAL HIGH (ref 2.5–4.6)
Potassium: 4.1 mmol/L (ref 3.5–5.1)
Potassium: 4.3 mmol/L (ref 3.5–5.1)
Sodium: 139 mmol/L (ref 135–145)
Sodium: 137 mmol/L (ref 135–145)

## 2018-03-17 LAB — PROTIME-INR
INR: 2.7 — ABNORMAL HIGH (ref 0.8–1.2)
Prothrombin Time: 28.4 seconds — ABNORMAL HIGH (ref 11.4–15.2)

## 2018-03-17 LAB — BLOOD GAS, ARTERIAL
Acid-base deficit: 8.4 mmol/L — ABNORMAL HIGH (ref 0.0–2.0)
Bicarbonate: 16.6 mmol/L — ABNORMAL LOW (ref 20.0–28.0)
FIO2: 0.1
LHR: 30 {breaths}/min
MECHVT: 480 mL
O2 Saturation: 98.2 %
PEEP: 8 cmH2O
Patient temperature: 98.6
pCO2 arterial: 33.7 mmHg (ref 32.0–48.0)
pH, Arterial: 7.314 — ABNORMAL LOW (ref 7.350–7.450)
pO2, Arterial: 141 mmHg — ABNORMAL HIGH (ref 83.0–108.0)

## 2018-03-17 LAB — POCT I-STAT 7, (LYTES, BLD GAS, ICA,H+H)
Acid-base deficit: 16 mmol/L — ABNORMAL HIGH (ref 0.0–2.0)
Acid-base deficit: 15 mmol/L — ABNORMAL HIGH (ref 0.0–2.0)
Bicarbonate: 10.5 mmol/L — ABNORMAL LOW (ref 20.0–28.0)
Bicarbonate: 11.3 mmol/L — ABNORMAL LOW (ref 20.0–28.0)
Calcium, Ion: 0.78 mmol/L — CL (ref 1.15–1.40)
Calcium, Ion: 0.79 mmol/L — CL (ref 1.15–1.40)
HCT: 24 % — ABNORMAL LOW (ref 36.0–46.0)
HCT: 33 % — ABNORMAL LOW (ref 36.0–46.0)
Hemoglobin: 8.2 g/dL — ABNORMAL LOW (ref 12.0–15.0)
Hemoglobin: 11.2 g/dL — ABNORMAL LOW (ref 12.0–15.0)
O2 SAT: 92 %
O2 Saturation: 98 %
PCO2 ART: 21.8 mmHg — AB (ref 32.0–48.0)
PH ART: 7.272 — AB (ref 7.350–7.450)
Patient temperature: 34.3
Potassium: 4.1 mmol/L (ref 3.5–5.1)
Potassium: 3.9 mmol/L (ref 3.5–5.1)
Sodium: 137 mmol/L (ref 135–145)
Sodium: 137 mmol/L (ref 135–145)
TCO2: 11 mmol/L — ABNORMAL LOW (ref 22–32)
TCO2: 12 mmol/L — ABNORMAL LOW (ref 22–32)
pCO2 arterial: 23.8 mmHg — ABNORMAL LOW (ref 32.0–48.0)
pH, Arterial: 7.275 — ABNORMAL LOW (ref 7.350–7.450)
pO2, Arterial: 60 mmHg — ABNORMAL LOW (ref 83.0–108.0)
pO2, Arterial: 104 mmHg (ref 83.0–108.0)

## 2018-03-17 LAB — CBC
HEMATOCRIT: 27.7 % — AB (ref 36.0–46.0)
Hemoglobin: 8.4 g/dL — ABNORMAL LOW (ref 12.0–15.0)
MCH: 28.7 pg (ref 26.0–34.0)
MCHC: 30.3 g/dL (ref 30.0–36.0)
MCV: 94.5 fL (ref 80.0–100.0)
Platelets: 160 10*3/uL (ref 150–400)
RBC: 2.93 MIL/uL — ABNORMAL LOW (ref 3.87–5.11)
RDW: 13.8 % (ref 11.5–15.5)
WBC: 17.9 10*3/uL — ABNORMAL HIGH (ref 4.0–10.5)
nRBC: 0.6 % — ABNORMAL HIGH (ref 0.0–0.2)

## 2018-03-17 LAB — LACTIC ACID, PLASMA
Lactic Acid, Venous: >11 mmol/L (ref 0.5–1.9)
Lactic Acid, Venous: >11 mmol/L (ref 0.5–1.9)

## 2018-03-17 LAB — COMPREHENSIVE METABOLIC PANEL
ALK PHOS: 211 U/L — AB (ref 38–126)
ALT: 3682 U/L — AB (ref 0–44)
AST: 5342 U/L — ABNORMAL HIGH (ref 15–41)
Albumin: 2.3 g/dL — ABNORMAL LOW (ref 3.5–5.0)
Anion gap: 32 — ABNORMAL HIGH (ref 5–15)
BUN: 32 mg/dL — ABNORMAL HIGH (ref 8–23)
CALCIUM: 6.4 mg/dL — AB (ref 8.9–10.3)
CO2: 11 mmol/L — ABNORMAL LOW (ref 22–32)
Chloride: 96 mmol/L — ABNORMAL LOW (ref 98–111)
Creatinine, Ser: 3.08 mg/dL — ABNORMAL HIGH (ref 0.44–1.00)
GFR calc Af Amer: 16 mL/min — ABNORMAL LOW (ref >60)
GFR calc non Af Amer: 14 mL/min — ABNORMAL LOW (ref >60)
Glucose, Bld: 90 mg/dL (ref 70–99)
Potassium: 4.1 mmol/L (ref 3.5–5.1)
Sodium: 139 mmol/L (ref 135–145)
Total Bilirubin: 3.5 mg/dL — ABNORMAL HIGH (ref 0.3–1.2)
Total Protein: 5 g/dL — ABNORMAL LOW (ref 6.5–8.1)

## 2018-03-17 LAB — GLUCOSE, CAPILLARY
GLUCOSE-CAPILLARY: 61 mg/dL — AB (ref 70–99)
Glucose-Capillary: 162 mg/dL — ABNORMAL HIGH (ref 70–99)
Glucose-Capillary: 167 mg/dL — ABNORMAL HIGH (ref 70–99)
Glucose-Capillary: 61 mg/dL — ABNORMAL LOW (ref 70–99)
Glucose-Capillary: 70 mg/dL (ref 70–99)
Glucose-Capillary: 74 mg/dL (ref 70–99)
Glucose-Capillary: 86 mg/dL (ref 70–99)
Glucose-Capillary: 87 mg/dL (ref 70–99)
Glucose-Capillary: 92 mg/dL (ref 70–99)

## 2018-03-17 LAB — HEPATITIS PANEL, ACUTE
HCV Ab: <0.1 s/co ratio (ref 0.0–0.9)
HEP A IGM: NEGATIVE
Hep B C IgM: NEGATIVE
Hepatitis B Surface Ag: NEGATIVE

## 2018-03-17 LAB — MAGNESIUM
Magnesium: 1.5 mg/dL — ABNORMAL LOW (ref 1.7–2.4)
Magnesium: 1.7 mg/dL (ref 1.7–2.4)
Magnesium: 1.7 mg/dL (ref 1.7–2.4)

## 2018-03-17 LAB — AMMONIA: Ammonia: 57 umol/L — ABNORMAL HIGH (ref 9–35)

## 2018-03-17 LAB — PHOSPHORUS
Phosphorus: 6.5 mg/dL — ABNORMAL HIGH (ref 2.5–4.6)
Phosphorus: 5.8 mg/dL — ABNORMAL HIGH (ref 2.5–4.6)
Phosphorus: 5.3 mg/dL — ABNORMAL HIGH (ref 2.5–4.6)

## 2018-03-17 MED ORDER — VITAL HIGH PROTEIN PO LIQD
1000.0000 mL | ORAL | Status: DC
  Administered 2018-03-17 – 2018-03-18 (×4): 1000 mL
  Administered 2018-03-18: 10:00:00

## 2018-03-17 MED ORDER — PIPERACILLIN-TAZOBACTAM 3.375 G IVPB: 3.3750 g | Freq: Two times a day (BID) | INTRAVENOUS | Status: DC

## 2018-03-17 MED ORDER — DEXTROSE 50 % IV SOLN
25.0000 mL | Freq: Once | INTRAVENOUS | Status: AC
  Administered 2018-03-18: 25 mL via INTRAVENOUS
  Filled 2018-03-17: qty 50

## 2018-03-17 MED ORDER — MIDAZOLAM HCL 2 MG/2ML IJ SOLN
2.0000 mg | INTRAMUSCULAR | Status: DC | PRN
  Filled 2018-03-17 (×2): qty 2

## 2018-03-17 MED ORDER — DEXTROSE 50 % IV SOLN
50.0000 mL | Freq: Once | INTRAVENOUS | Status: AC
  Administered 2018-03-17: 50 mL via INTRAVENOUS

## 2018-03-17 MED ORDER — ALBUMIN HUMAN 25 % IV SOLN
12.5000 g | Freq: Once | INTRAVENOUS | Status: AC
  Administered 2018-03-17: 12.5 g via INTRAVENOUS
  Filled 2018-03-17: qty 50

## 2018-03-17 MED ORDER — PANTOPRAZOLE SODIUM 40 MG IV SOLR
40.0000 mg | INTRAVENOUS | Status: DC
  Administered 2018-03-17: 40 mg via INTRAVENOUS
  Filled 2018-03-17: qty 40

## 2018-03-17 MED ORDER — B COMPLEX-C PO TABS
1.0000 | ORAL_TABLET | Freq: Every day | ORAL | Status: DC
  Administered 2018-03-17 – 2018-03-18 (×2): 1 via ORAL
  Filled 2018-03-17 (×2): qty 1

## 2018-03-17 MED ORDER — EPINEPHRINE PF 1 MG/ML IJ SOLN
0.5000 ug/min | INTRAVENOUS | Status: DC
  Filled 2018-03-17: qty 4

## 2018-03-17 MED ORDER — PIPERACILLIN-TAZOBACTAM 3.375 G IVPB 30 MIN
3.3750 g | Freq: Four times a day (QID) | INTRAVENOUS | Status: DC
  Administered 2018-03-17 – 2018-03-18 (×5): 3.375 g via INTRAVENOUS
  Filled 2018-03-17 (×8): qty 50

## 2018-03-17 MED ORDER — CALCIUM GLUCONATE-NACL 1-0.675 GM/50ML-% IV SOLN
1.0000 g | Freq: Once | INTRAVENOUS | Status: AC
  Administered 2018-03-17: 1000 mg via INTRAVENOUS
  Filled 2018-03-17: qty 50

## 2018-03-17 MED ORDER — PRO-STAT SUGAR FREE PO LIQD
30.0000 mL | Freq: Two times a day (BID) | ORAL | Status: DC
  Administered 2018-03-17 – 2018-03-18 (×3): 30 mL
  Filled 2018-03-17 (×3): qty 30

## 2018-03-17 MED ORDER — MIDAZOLAM HCL 2 MG/2ML IJ SOLN
2.0000 mg | Freq: Once | INTRAMUSCULAR | Status: AC
  Administered 2018-03-17: 2 mg via INTRAVENOUS
  Filled 2018-03-17: qty 2

## 2018-03-17 MED ORDER — INSULIN ASPART 100 UNIT/ML ~~LOC~~ SOLN: 1.0000 [IU] | SUBCUTANEOUS | Status: DC

## 2018-03-17 NOTE — Progress Notes (Addendum)
eLink Physician-Brief Progress Note Patient Name: Caitlin Yates DOB: May 03, 1940 MRN: [id number]   Date of Service  03/17/2018  HPI/Events of Note  Informed by bedside ICU RN that she noted 72mm on the R and 81mm pupil size on the L.  Pt wakes up and is able to follow simple commands.   Ca also low.  Pt is on CRRT.  eICU Interventions  Fentanyl gtt has been decreased.  Continue to monitor for now.   Give 1g Calcium gluconate.  Will discusse with nephrology if we can change solution to higher Ca (now at 2.5).     Intervention Category Intermediate Interventions: Other:  Elsie Lincoln 03/17/2018, 9:07 PM   6:37 AM  No change in the Ca level after 1g Ca gluconate. Informed of coagulopathy as well with INR 4.   Plan> Monitor INR for now.  H&H stable.  Give 2g Ca gluconate IV.

## 2018-03-17 NOTE — Progress Notes (Signed)
Family has request to change patient to limited resuscitation.  Okay to continue current medical therapies.  However, no CPR or defibrillation in the event of cardiac arrest.   Chesley Mires, MD [place] 03/17/2018, 3:35 PM

## 2018-03-17 NOTE — Progress Notes (Signed)
Hypoglycemic Event  CBG: 61  Treatment: D50 50 mL (25 gm)  Symptoms: None  Follow-up CBG: Time:1125 CBG Result:162  Possible Reasons for Event: Inadequate meal intake  Comments/MD notified:Yes    Maston Wight E

## 2018-03-17 NOTE — Progress Notes (Addendum)
Nutrition Follow-up  DOCUMENTATION CODES:   Not applicable  INTERVENTION:   Tube feeding:  -Vital High Protein @ 40 ml/hr via OGT -Increase by 10 ml Q6 hours to goal rate of 55 ml/hr (1320 ml) -30 ml Prostat BID  Provides: 1520 kcals, 146 grams protein, 1104 ml free water. Meets 105% calorie needs and 100% of protein needs.   Recommend addition of B complex with Vitamin C supplementation.   NUTRITION DIAGNOSIS:   Inadequate oral intake related to inability to eat as evidenced by NPO status.  GOAL:   Provide needs based on ASPEN/SCCM guidelines  MONITOR:   Diet advancement, Vent status, Skin, TF tolerance, Weight trends, I & O's, Labs  REASON FOR ASSESSMENT:   Consult, Ventilator Enteral/tube feeding initiation and management  ASSESSMENT:   Patient with PMH significant for HTN, DM, HLD, DJD, GERD, and CKD III. Presents this admission with hemorrhagic cholecystitis and septic shock.    3/1- start CRRT, perc chole drain placed   No family at bedside.   Pt on CRRT, no net UF at this time. Requiring high dose pressors. No BM or bowel sounds since admit per nurse. Okay to start feeding per surgergy. Plan to titrate TF per toleration.   Weight noted to increase from admission weight 76.2 kg on 2/29 to 81.6 kg today. Will utilize 76.2 as EDW.   Patient is currently intubated on ventilator support MV: 14.1 L/min Temp (24hrs), Avg:96.3 F (35.7 C), Min:93.2 F (34 C), Max:99.1 F (37.3 C) BP: 96/53 MAP: 52 (A line)  I/O: +5773 since admit UOP: 10 ml x 24 hrs OGT: 80 ml x 24 hrs  Chole drain- 100 ml x 24 hrs   Medications reviewed and include: solucortef, SS novolog, precedex, D5 in 1/2NS @ 125 ml/hr, Epinephrine, Levophed 40 mcg, Vasopressin, Giapreza Labs reviewed: corrected calcium 7.8 (L) Phosphorus 5.8 (H) CBG 61-162 LFTs- elevated   NUTRITION - FOCUSED PHYSICAL EXAM:    Most Recent Value  Orbital Region  No depletion  Upper Arm Region  No depletion   Thoracic and Lumbar Region  Unable to assess  Buccal Region  Unable to assess  Temple Region  Mild depletion  Clavicle Bone Region  No depletion  Clavicle and Acromion Bone Region  No depletion  Scapular Bone Region  Unable to assess  Dorsal Hand  Unable to assess [mits]  Patellar Region  Mild depletion  Anterior Thigh Region  Mild depletion  Posterior Calf Region  Mild depletion  Edema (RD Assessment)  Mild  Hair  Reviewed  Eyes  Unable to assess  Mouth  Unable to assess  Skin  Reviewed  Nails  Unable to assess     Diet Order:   Diet Order            Diet NPO time specified  Diet effective now              EDUCATION NEEDS:   Not appropriate for education at this time  Skin:  Skin Assessment: Reviewed RN Assessment  Last BM:  PTA  Height:   Ht Readings from Last 1 Encounters:  03/16/18 5\' 6"  (1.676 m)    Weight:   Wt Readings from Last 1 Encounters:  03/17/18 81.6 kg    Ideal Body Weight:  59.1 kg  BMI:  Body mass index is 29.04 kg/m.  Estimated Nutritional Needs:   Kcal:  1442 kcal  Protein:  125-145 grams  Fluid:  1000 ml + UOP   Mitchell Epling RD,  LDN Clinical Nutrition Pager # - [phone]

## 2018-03-17 NOTE — Progress Notes (Signed)
Referring Physician(s): Dr Halford Chessman  Supervising Physician: Aletta Edouard  Patient Status:  [facility] - In-pt  Chief Complaint:  Chole drain placed in IR yesterday  Subjective:  Pt is sedated now OP bloody/bilious   Allergies: Lisinopril and Statins  Medications: Prior to Admission medications   Medication Sig Start Date End Date Taking? Authorizing Provider  amLODipine (NORVASC) 10 MG tablet TAKE 1 TABLET BY MOUTH ONCE DAILY. Patient taking differently: Take 10 mg by mouth every morning.  05/06/17  Yes Branch, Alphonse Guild, MD  aspirin EC 81 MG tablet Take 81 mg by mouth every morning.   Yes [provider]  atenolol-chlorthalidone (TENORETIC) 50-25 MG per tablet Take 1 tablet by mouth every morning.   Yes [provider]  atorvastatin (LIPITOR) 10 MG tablet Take 10 mg by mouth at bedtime.    Yes [provider]  clopidogrel (PLAVIX) 75 MG tablet Take 75 mg by mouth every morning.   Yes [provider]  HYDROcodone-acetaminophen (NORCO/VICODIN) 5-325 MG tablet Take 1 tablet by mouth every 6 (six) hours as needed for moderate pain.   Yes [provider]  predniSONE (DELTASONE) 20 MG tablet Take 20 mg by mouth daily with breakfast. 5 day course   Yes [provider]  cyclobenzaprine (FLEXERIL) 10 MG tablet Take 10 mg by mouth 3 (three) times daily as needed for muscle spasms.    [provider]  DEXILANT 60 MG capsule Take 60 mg by mouth daily.  07/16/14   [provider]  gabapentin (NEURONTIN) 300 MG capsule Take 300 mg by mouth 3 (three) times daily.    [provider]  glimepiride (AMARYL) 4 MG tablet Take 4 mg by mouth daily with breakfast.  02/04/15   [provider]  Multiple Vitamins-Minerals (CENTURY MATURE ADULT FORMULA PO) Take 1 tablet by mouth every morning.    [provider]  naproxen (NAPROSYN) 500 MG tablet Take 500 mg by mouth every 12 (twelve) hours as needed.  09/11/16    [provider]  potassium chloride (K-DUR) 10 MEQ tablet Take 1 tablet (10 mEq total) by mouth daily. 12/28/16   Milton Ferguson, MD  Semaglutide,0.25 or 0.5MG /DOS, (OZEMPIC, 0.25 OR 0.5 MG/DOSE,) 2 MG/1.5ML SOPN Inject into the skin.    [provider]  sitaGLIPtan-metformin (JANUMET) 50-1000 MG per tablet Take 1 tablet by mouth 2 (two) times daily.    [provider]  spironolactone (ALDACTONE) 25 MG tablet Take 25 mg by mouth daily.    [provider]  spironolactone (ALDACTONE) 25 MG tablet TAKE (1/2) TABLET BY MOUTH ONCE DAILY. 09/04/17   Arnoldo Lenis, MD  Vitamin D, Ergocalciferol, (DRISDOL) 50000 units CAPS capsule Take 50,000 Units by mouth every Wednesday.    [provider]     Vital Signs: BP (!) 81/50   Pulse 74   Temp (!) 94.8 F (34.9 C)   Resp (!) 30   Ht 5\' 6"  (1.676 m)   Wt 179 lb 14.3 oz (81.6 kg)   SpO2 97%   BMI 29.04 kg/m   Physical Exam Vitals signs reviewed.  Constitutional:      Comments: Sedated- no repsonse  Abdominal:     General: Bowel sounds are decreased.     Palpations: Abdomen is soft.  Skin:    General: Skin is warm and dry.     Comments: Site is clean and dry No bleeding OP bloody/bilious Flushed easily- 9 cc sterile saline  Imaging: Ct Abdomen Pelvis Wo Contrast  Result Date: 02/23/2018 CLINICAL DATA:  Left upper quadrant abdominal pain and nausea and vomiting that started today. EXAM: CT ABDOMEN AND PELVIS WITHOUT CONTRAST TECHNIQUE: Multidetector CT imaging of the abdomen and pelvis was performed following the standard protocol without IV contrast. COMPARISON:  CT scan and 08/21/2012 FINDINGS: Lower chest: Bibasilar scarring and atelectasis. No infiltrates or effusions. Heart is borderline enlarged. Three-vessel coronary artery calcifications are noted. Hepatobiliary: No focal hepatic lesions are identified without contrast. The gallbladder is markedly distended and contains high  attenuation material worrisome for hemorrhage. This measures approximately 68 Hounsfield units and I think sludge is un likely. Vicarious excretion of contrast material is also possible if the patient has had a recent contrast enhanced scan some rales. Right upper quadrant ultrasound examination may be helpful for further evaluation. No intra or extrahepatic biliary dilatation. Pancreas: No mass, inflammation or ductal dilatation. Spleen: Normal size.  No focal lesions. Adrenals/Urinary Tract: The bladder is grossly normal. Stomach/Bowel: The stomach, duodenum, small bowel and colon are grossly normal. No acute inflammatory process, mass lesions or obstructive findings. The appendix is not identified for certain and may be surgically absent. Vascular/Lymphatic: Advanced atherosclerotic calcifications involving the aorta and iliac arteries and femoral arteries. Left common iliac artery stent is noted. No mesenteric or retroperitoneal mass or adenopathy. Reproductive: Surgically absent. Other: No pelvic mass or adenopathy. No free pelvic fluid collections. No inguinal mass or adenopathy. No abdominal wall hernia or subcutaneous lesions. Musculoskeletal: No significant bony findings. IMPRESSION: 1. Distended gallbladder with high attenuation material highly suspicious for hemorrhagic cholecystitis. Recommend clinical correlation. Right upper quadrant ultrasound examination may be helpful for further evaluation. 2. Small bilateral adrenal gland adenomas. 3. Severe atherosclerotic calcifications involving the aorta and iliac arteries and branch vessels. There also three-vessel coronary artery calcifications. Electronically Signed   By: Marijo Sanes M.D.   On: 02/17/2018 20:22   Ir Perc Cholecystostomy  Result Date: 03/16/2018 CLINICAL DATA:  Acute hemorrhagic cholecystitis, sepsis EXAM: PERCUTANEOUS CHOLECYSTOSTOMY TUBE PLACEMENT WITH ULTRASOUND AND FLUOROSCOPIC GUIDANCE FLUOROSCOPY TIME:  90 seconds; 11 mGy  TECHNIQUE: The procedure, risks (including but not limited to bleeding, infection, organ damage ), benefits, and alternatives were explained to the patient. Questions regarding the procedure were encouraged and answered. The patient understands and consents to the procedure. Survey ultrasound of the abdomen was performed and an appropriate skin entry site was identified. Skin site was marked, prepped with chlorhexidine, and draped in usual sterile fashion, and infiltrated locally with 1% lidocaine. Intravenous Fentanyl and Versed were administered as conscious sedation during continuous monitoring of the patient's level of consciousness and physiological / cardiorespiratory status by the radiology RN, with a total moderate sedation time of 12 minutes. Under real-time ultrasound guidance, gallbladder was accessed using a transhepatic approach with a 21-gauge needle. Ultrasound image documentation was saved. Bile returned through the hub. Needle was exchanged over a 018 guidewire for transitional dilator which allowed placement of 035 J wire. Over this, a 10.2 French pigtail catheter was advanced and formed centrally in the gallbladder lumen. 20 mL of blackish green bile were aspirated, sent for culture. Small contrast injection confirmed appropriate position. Catheter secured externally with 0 Prolene suture and placed external drain bag. Patient tolerated the procedure well. COMPLICATIONS: COMPLICATIONS none IMPRESSION: 1. Technically successful percutaneous cholecystostomy tube placement with ultrasound and fluoroscopic guidance. Electronically Signed   By: Lucrezia Europe M.D.   On: 03/16/2018 08:02   Dg Chest [facility] 1 View  Result  Date: 03/17/2018 CLINICAL DATA:  ARDS EXAM: PORTABLE CHEST 1 VIEW COMPARISON:  03/16/2018, 03/12/2018 FINDINGS: Endotracheal tube tip is 4.4 cm superior to the carina. Bilateral central venous catheter tips over the SVC. Esophageal tube is looped within the stomach. Mild cardiomegaly.  Similar appearance of multifocal ground-glass opacities within the right greater than left lungs. IMPRESSION: 1. Support lines and tubes as above. 2. Overall no significant change in multifocal bilateral consolidations and ground-glass opacities as compared with most recent prior radiograph Electronically Signed   By: Donavan Foil M.D.   On: 03/17/2018 03:32   Dg Chest Port 1 View  Result Date: 03/16/2018 CLINICAL DATA:  Central line placement. EXAM: PORTABLE CHEST 1 VIEW COMPARISON:  Chest radiograph earlier this day. FINDINGS: New right internal jugular central venous catheter tip projects over the mid SVC. No pneumothorax. Unchanged left internal jugular central venous catheter tip projecting over the lower SVC. Endotracheal tube tip at the level of the clavicles 5.4 cm from the carina. Enteric tube in place with tip and side-port below the diaphragm in the stomach. Development of retrocardiac opacities since prior exam. Slight worsening patchy opacities in the right lung. Unchanged heart size and mediastinal contours. No pneumothorax. IMPRESSION: 1. New right internal jugular central venous catheter tip projects over the mid SVC. Support apparatus otherwise unchanged. 2. Development of retrocardiac opacities, may be atelectasis, aspiration or pneumonia. Slight worsening patchy opacities throughout the right lung. Electronically Signed   By: Keith Rake M.D.   On: 03/16/2018 21:30   Portable Chest X-ray  Result Date: 03/16/2018 CLINICAL DATA:  Intubated EXAM: PORTABLE CHEST 1 VIEW COMPARISON:  03/12/2018 FINDINGS: Endotracheal tube terminates 4.5 cm above the carina. Left IJ venous catheter terminates cavoatrial junction. Enteric tube courses into the proximal stomach. Defibrillator pads overlying the left hemithorax. Multifocal patchy opacities in the right upper lobe, new, suspicious for pneumonia versus aspiration. Left lung is essentially clear. No pleural effusion or pneumothorax. Cardiomegaly.  IMPRESSION: Endotracheal tube terminates 4.5 cm above the carina. Additional support apparatus as above. Multifocal patchy right upper lobe opacities, new, suspicious for pneumonia versus aspiration. Electronically Signed   By: Julian Hy M.D.   On: 03/16/2018 16:08   Dg Abd Acute W/chest  Result Date: 02/28/2018 CLINICAL DATA:  Left upper quadrant pain, distention, and nausea and vomiting beginning today. Fever. EXAM: DG ABDOMEN ACUTE W/ 1V CHEST COMPARISON:  None. FINDINGS: There is no evidence of dilated bowel loops or free intraperitoneal air. No radiopaque calculi or other significant radiographic abnormality is seen. Peripheral vascular calcification noted. Low lung volumes are noted, with mild bibasilar atelectasis. No evidence of pulmonary consolidation or edema. No evidence of pleural effusion. Heart size is normal. IMPRESSION: 1. Unremarkable bowel gas pattern. 2. Low lung volumes with mild bibasilar atelectasis. Electronically Signed   By: Earle Gell M.D.   On: 03/05/2018 18:10   US Abdomen Limited Ruq  Result Date: 03/17/2018 CLINICAL DATA:  78 year old female with right upper quadrant abdominal pain status post percutaneous cholecystostomy tube yesterday. EXAM: ULTRASOUND ABDOMEN LIMITED RIGHT UPPER QUADRANT COMPARISON:  03/16/2018 and earlier. FINDINGS: Gallbladder: The cholecystostomy tube is visible within the lumen on image 4. There is echogenic debris within the lumen. The gallbladder wall is thickened up to 6 millimeters and there is a small volume of pericholecystic fluid (image 7). Common bile duct: Diameter: 6 millimeters, upper limits of normal. Liver: No focal lesion identified. Within normal limits in parenchymal echogenicity. No intrahepatic biliary ductal dilatation. Portal vein is patent on  color Doppler imaging with normal direction of blood flow towards the liver. Other findings: Right pleural effusion visible on image 22. Negative visible right kidney. IMPRESSION: 1.  Cholecystostomy tube visible within the gallbladder lumen. Gallbladder wall thickening, small volume pericholecystic fluid, and continued hemorrhage or debris within the gallbladder. 2. CBD at the upper limits of normal. No intrahepatic biliary ductal dilatation. 3. Right pleural effusion. Electronically Signed   By: Genevie Ann M.D.   On: 03/17/2018 07:10    Labs:  CBC: Recent Labs    03/16/18 0239 03/16/18 0513 03/16/18 0529 03/16/18 1157 03/17/18 0224 03/17/18 0408 03/17/18 0434  WBC 4.7  --  7.6 10.2  --   --  17.9*  HGB 8.5*  --  9.1* 8.8* 8.2* 11.2* 8.4*  HCT 27.6*  --  28.7* 28.0* 24.0* 33.0* 27.7*  PLT 313 322 343 287  --   --  160    COAGS: Recent Labs    02/22/2018 1704 03/16/18 0513 03/17/18 0434  INR 1.1 1.5* 2.7*  APTT  --  35  --     BMP: Recent Labs    03/16/18 0529 03/16/18 1157 03/17/18 0224 03/17/18 0408 03/17/18 0434 03/17/18 0435  NA 138 135 137 137 139 139  K 3.7 4.5 4.1 3.9 4.1 4.1  CL 109 109  --   --  96* 97*  CO2 16* 12*  --   --  11* 11*  GLUCOSE 148* 72  --   --  90 89  BUN 42* 48*  --   --  32* 32*  CALCIUM 7.7* 7.3*  --   --  6.4* 6.4*  CREATININE 3.15* 3.58*  --   --  3.08* 2.99*  GFRNONAA 13* 11*  --   --  14* 14*  GFRAA 15* 13*  --   --  16* 17*    LIVER FUNCTION TESTS: Recent Labs    03/10/2018 1704 03/16/18 0529 03/17/18 0434 03/17/18 0435  BILITOT 1.4* 1.7* 3.5*  --   AST 4,149* 8,024* 5,342*  --   ALT 1,913* 3,038* 3,682*  --   ALKPHOS 285* 237* 211*  --   PROT 7.1 5.4* 5.0*  --   ALBUMIN 3.8 2.6* 2.3* 2.3*    Assessment and Plan:  Perc chole drain intact Will follow Cont to flush drain every shift Drain will stay in 6-8 weeks-- unless goes to surgery  Electronically Signed: Lavonia Drafts, PA-C 03/17/2018, 11:42 AM   I spent a total of 15 Minutes at the the patient's bedside AND on the patient's hospital floor or unit, greater than 50% of which was counseling/coordinating care for perc chole drain

## 2018-03-17 NOTE — Progress Notes (Signed)
NAME:  Caitlin Yates, MRN:  [id number], DOB:  09-18-1940, LOS: 2 ADMISSION DATE:  02/15/2018, CONSULTATION DATE:  03/16/2018 REFERRING MD:  Ainara Sella, CHIEF COMPLAINT:  Abdominal pain  Brief History   78 yo female presented with acute abdominal pain, fever 103.5.  Found to have hemorrhagic cholecystitis and septic shock.  Past Medical History  DM, HTN, GERD, [place] [facility] Events   2/29 admit to hospitalist at [facility] 3/01 transfer to [facility] ICU, increased pressor needs, VDRF, start CRRT  Consults:  IR 3/1 Gen Surg 3/1 Nephrology 3/1  Procedures:  Percutaneous cholecystostomy 3/01 >>  ETT 3/01 >>  Lt IJ CVL 3/01 >>  Rt radial A line 3/01 >>  Rt IJ HD cath 3/01 >>  Significant Diagnostic Tests:  CT abd/pelvis w/o contrast 2/29 > distended GB, atherosclerosis Ab u/s 3/02 >> GB thickening with hemorrhage/debris in GB, Rt pleural effusion  Micro Data:  BCx 2/29> UCx 2/29> GB fluid 3/01>   Antimicrobials:  Zosyn 2/29 >>  Interim history/subjective:  Remains on multiple pressors, CRRT, vent support.  Objective   Blood pressure (!) 94/40, pulse 74, temperature (!) 94.5 F (34.7 C), resp. rate 19, height 5\' 6"  (1.676 m), weight 81.6 kg, SpO2 97 %.    Vent Mode: PRVC FiO2 (%):  [70 %-100 %] 100 % Set Rate:  [28 bmp-30 bmp] 30 bmp Vt Set:  [480 mL-550 mL] 480 mL PEEP:  [8 cmH20-12 cmH20] 12 cmH20 Plateau Pressure:  [20 cmH20-22 cmH20] 22 cmH20   Intake/Output Summary (Last 24 hours) at 03/17/2018 0949 Last data filed at 03/17/2018 0900 Gross per 24 hour  Intake 6505.25 ml  Output 4228 ml  Net 2277.25 ml   Filed Weights   03/08/2018 1618 03/16/18 0400 03/17/18 0500  Weight: 76.2 kg 77.4 kg 81.6 kg    Examination:  General - sedated Eyes - pupils reactive ENT - ETT in place Cardiac - regular rate/rhythm, no murmur Chest - equal breath sounds b/l, no wheezing or rales Abdomen - soft, decreased BS, drain in RUQ Extremities - 1+ edema Skin - no  rashes Neuro - RASS -2  Assessment & Plan:   Septic shock from acute hemorrhagic cholecystitis. Plan - pressors to keep MAP > 60 - continue solucortef for relative adrenal insufficiency - albumin x 1 on 3/02 - day 3 of zosyn - cholecystostomy per IR and surgery  Acute hypoxic respiratory failure. Plan - full vent support - adjust PEEP, FiO2 to keep SpO2 88 to 95% - f/u CXR, ABG  Acute renal failure with ATN. CKD 3. Metabolic acidosis. Plan - continue CRRT >> even fluid balance - f/u renal panel, ABG, renal u/s  Elevated LFTs from shock and cholecystitis. Plan - f/u LFTs  Anemia of critical illness. Coagulopathy from liver failure. Plan - f/u CBC, INR - transfuse for HB < 7  Acute metabolic encephalopathy. 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 Plan - SSI - dextrose in IV fluids  Hx of HTN, HLD. Plan - hold outpt norvasc, tenormin, chlorthalidone, spironolactone  Best practice:  Diet: tube feeds DVT prophylaxis: SCDs GI prophylaxis: Protonix Mobility: bed rest Code Status: FULL Family Communication: no family at bedside Disposition: ICU  Labs    CMP Latest Ref Rng & Units 03/17/2018 03/17/2018 03/17/2018  Glucose 70 - 99 mg/dL 89 90 -  BUN 8 - 23 mg/dL 32(H) 32(H) -  Creatinine 0.44 - 1.00 mg/dL 2.99(H) 3.08(H) -  Sodium 135 - 145 mmol/L 139 139 137  Potassium 3.5 - 5.1 mmol/L 4.1 4.1 3.9  Chloride 98 - 111 mmol/L 97(L) 96(L) -  CO2 22 - 32 mmol/L 11(L) 11(L) -  Calcium 8.9 - 10.3 mg/dL 6.4(LL) 6.4(LL) -  Total Protein 6.5 - 8.1 g/dL - 5.0(L) -  Total Bilirubin 0.3 - 1.2 mg/dL - 3.5(H) -  Alkaline Phos 38 - 126 U/L - 211(H) -  AST 15 - 41 U/L - 5,342(H) -  ALT 0 - 44 U/L - 3,682(H) -   CBC Latest Ref Rng & Units 03/17/2018 03/17/2018 03/17/2018  WBC 4.0 - 10.5 K/uL 17.9(H) - -  Hemoglobin 12.0 - 15.0 g/dL 8.4(L) 11.2(L) 8.2(L)  Hematocrit 36.0 - 46.0 % 27.7(L) 33.0(L) 24.0(L)  Platelets 150 - 400 K/uL 160 - -   CBG  (last 3)  Recent Labs    03/16/18 2306 03/17/18 0431 03/17/18 0741  GLUCAP 84 86 70   ABG    Component Value Date/Time   PHART 7.272 (L) 03/17/2018 0408   PCO2ART 23.8 (L) 03/17/2018 0408   PO2ART 104.0 03/17/2018 0408   HCO3 11.3 (L) 03/17/2018 0408   TCO2 12 (L) 03/17/2018 0408   ACIDBASEDEF 15.0 (H) 03/17/2018 0408   O2SAT 98.0 03/17/2018 0408   CC time 33 minutes  Chesley Mires, MD [place] 03/17/2018, 10:10 AM

## 2018-03-17 NOTE — Progress Notes (Signed)
[facility]/Trauma Progress Note      Assessment/Plan Principal Problem:   Acute cholecystitis Active Problems:   Diabetes mellitus without complication (HCC)   GERD (gastroesophageal reflux disease)   Hypertension   Metabolic acidosis   Neutropenia (HCC)   Acute lower UTI   Hypokalemia   Septic shock (HCC)   Shock (HCC)  Cholecystitis  - CT showed possible hemorrhagic cholecystitis - S/P IR perc chole drain, 03/01 with 80mL of blackish green bile aspirate  - [place] today showed cholecystostomy tube within lumen, continued hemorrhage or debris within gallbladder - continue drain, pt has OG tube, start TF's Septic shock - lactic acid 11 and on pressors  - per CCM  FEN: OG, TF's VTE: SCD's, heparin for CRRT ID: Zosyn 03/02>> Follow up: TBD  DISPO: continue current management, nothing surgical at this time, start TF's. we will follow    LOS: 2 days    Subjective: CC: septic shock  Pt denies abdominal pain. She is awake and alert on the vent.   Objective: Vital signs in last 24 hours: Temp:  [93.2 F (34 C)-99.1 F (37.3 C)] 94.1 F (34.5 C) (03/02 0745) Pulse Rate:  [46-94] 73 (03/02 0755) Resp:  [22-36] 25 (03/02 0755) BP: (70-141)/(40-82) 86/68 (03/02 0718) SpO2:  [88 %-100 %] 100 % (03/02 0755) Arterial Line BP: (90-140)/(36-55) 113/44 (03/02 0755) FiO2 (%):  [70 %-100 %] 100 % (03/02 0755) Weight:  [81.6 kg] 81.6 kg (03/02 0500) Last BM Date: (PTA)  Intake/Output from previous day: 03/01 0701 - 03/02 0700 In: 6172.2 [I.V.:5820.6; IV Piggyback:301.6] Out: 3396 [Urine:10; Emesis/NG output:80; Drains:100] Intake/Output this shift: Total I/O In: 326.4 [I.V.:321.9; IV Piggyback:4.5] Out: 391 [Other:391]  PE: Gen:  Alert, awake and following commands Card:  RRR Pulm:  On vent Abd: Soft, NT/ND, +BS, per chole drain in place with sanguinous drainage Skin: warm and dry   Anti-infectives: Anti-infectives (From admission, onward)   Start      Dose/Rate Route Frequency Ordered Stop   03/17/18 1800  piperacillin-tazobactam (ZOSYN) IVPB 3.375 g  Status:  Discontinued     3.375 g 12.5 mL/hr over 240 Minutes Intravenous Every 12 hours 03/17/18 0648 03/17/18 0701   03/17/18 1200  piperacillin-tazobactam (ZOSYN) IVPB 3.375 g     3.375 g 100 mL/hr over 30 Minutes Intravenous Every 6 hours 03/17/18 0701     03/17/18 0000  piperacillin-tazobactam (ZOSYN) IVPB 3.375 g  Status:  Discontinued     3.375 g 100 mL/hr over 30 Minutes Intravenous Every 6 hours 03/16/18 1915 03/17/18 0648   03/16/18 2200  piperacillin-tazobactam (ZOSYN) IVPB 3.375 g  Status:  Discontinued     3.375 g 100 mL/hr over 30 Minutes Intravenous Every 6 hours 03/16/18 1914 03/16/18 1915   03/16/18 1800  cefTRIAXone (ROCEPHIN) 2 g in sodium chloride 0.9 % 100 mL IVPB  Status:  Discontinued     2 g 200 mL/hr over 30 Minutes Intravenous Every 24 hours 03/16/18 1331 03/16/18 1510   03/16/18 1600  piperacillin-tazobactam (ZOSYN) IVPB 3.375 g  Status:  Discontinued     3.375 g 12.5 mL/hr over 240 Minutes Intravenous Every 8 hours 03/16/18 1510 03/16/18 1537   03/16/18 1545  piperacillin-tazobactam (ZOSYN) IVPB 3.375 g  Status:  Discontinued     3.375 g 12.5 mL/hr over 240 Minutes Intravenous Every 12 hours 03/16/18 1537 03/16/18 1913   03/16/18 1515  metroNIDAZOLE (FLAGYL) IVPB 500 mg  Status:  Discontinued     500 mg 100 mL/hr over 60  Minutes Intravenous Every 8 hours 03/16/18 1510 03/16/18 1641   03/16/18 0545  cefTRIAXone (ROCEPHIN) 1 g in sodium chloride 0.9 % 100 mL IVPB  Status:  Discontinued     1 g 200 mL/hr over 30 Minutes Intravenous Every 24 hours 03/16/18 0546 03/16/18 1331   03/10/2018 2030  piperacillin-tazobactam (ZOSYN) IVPB 3.375 g     3.375 g 100 mL/hr over 30 Minutes Intravenous  Once 02/18/2018 2025 02/19/2018 2059      Lab Results:  Recent Labs    03/16/18 1157  03/17/18 0408 03/17/18 0434  WBC 10.2  --   --  17.9*  HGB 8.8*   < > 11.2* 8.4*  HCT  28.0*   < > 33.0* 27.7*  PLT 287  --   --  160   < > = values in this interval not displayed.   BMET Recent Labs    03/17/18 0434 03/17/18 0435  NA 139 139  K 4.1 4.1  CL 96* 97*  CO2 11* 11*  GLUCOSE 90 89  BUN 32* 32*  CREATININE 3.08* 2.99*  CALCIUM 6.4* 6.4*   PT/INR Recent Labs    03/16/18 0513 03/17/18 0434  LABPROT 17.6* 28.4*  INR 1.5* 2.7*   CMP     Component Value Date/Time   NA 139 03/17/2018 0435   K 4.1 03/17/2018 0435   CL 97 (L) 03/17/2018 0435   CO2 11 (L) 03/17/2018 0435   GLUCOSE 89 03/17/2018 0435   BUN 32 (H) 03/17/2018 0435   CREATININE 2.99 (H) 03/17/2018 0435   CREATININE 1.20 (H) 03/09/2015 1006   CALCIUM 6.4 (LL) 03/17/2018 0435   PROT 5.0 (L) 03/17/2018 0434   ALBUMIN 2.3 (L) 03/17/2018 0435   AST 5,342 (H) 03/17/2018 0434   ALT 3,682 (H) 03/17/2018 0434   ALKPHOS 211 (H) 03/17/2018 0434   BILITOT 3.5 (H) 03/17/2018 0434   GFRNONAA 14 (L) 03/17/2018 0435   GFRAA 17 (L) 03/17/2018 0435   Lipase     Component Value Date/Time   LIPASE 69 (H) 02/21/2018 1704    Studies/Results: Ct Abdomen Pelvis Wo Contrast  Result Date: 02/18/2018 CLINICAL DATA:  Left upper quadrant abdominal pain and nausea and vomiting that started today. EXAM: CT ABDOMEN AND PELVIS WITHOUT CONTRAST TECHNIQUE: Multidetector CT imaging of the abdomen and pelvis was performed following the standard protocol without IV contrast. COMPARISON:  CT scan and 08/21/2012 FINDINGS: Lower chest: Bibasilar scarring and atelectasis. No infiltrates or effusions. Heart is borderline enlarged. Three-vessel coronary artery calcifications are noted. Hepatobiliary: No focal hepatic lesions are identified without contrast. The gallbladder is markedly distended and contains high attenuation material worrisome for hemorrhage. This measures approximately 68 Hounsfield units and I think sludge is un likely. Vicarious excretion of contrast material is also possible if the patient has had a  recent contrast enhanced scan some rales. Right upper quadrant ultrasound examination may be helpful for further evaluation. No intra or extrahepatic biliary dilatation. Pancreas: No mass, inflammation or ductal dilatation. Spleen: Normal size.  No focal lesions. Adrenals/Urinary Tract: The bladder is grossly normal. Stomach/Bowel: The stomach, duodenum, small bowel and colon are grossly normal. No acute inflammatory process, mass lesions or obstructive findings. The appendix is not identified for certain and may be surgically absent. Vascular/Lymphatic: Advanced atherosclerotic calcifications involving the aorta and iliac arteries and femoral arteries. Left common iliac artery stent is noted. No mesenteric or retroperitoneal mass or adenopathy. Reproductive: Surgically absent. Other: No pelvic mass or adenopathy. No free pelvic fluid  collections. No inguinal mass or adenopathy. No abdominal wall hernia or subcutaneous lesions. Musculoskeletal: No significant bony findings. IMPRESSION: 1. Distended gallbladder with high attenuation material highly suspicious for hemorrhagic cholecystitis. Recommend clinical correlation. Right upper quadrant ultrasound examination may be helpful for further evaluation. 2. Small bilateral adrenal gland adenomas. 3. Severe atherosclerotic calcifications involving the aorta and iliac arteries and branch vessels. There also three-vessel coronary artery calcifications. Electronically Signed   By: Marijo Sanes M.D.   On: 02/27/2018 20:22   Ir Perc Cholecystostomy  Result Date: 03/16/2018 CLINICAL DATA:  Acute hemorrhagic cholecystitis, sepsis EXAM: PERCUTANEOUS CHOLECYSTOSTOMY TUBE PLACEMENT WITH ULTRASOUND AND FLUOROSCOPIC GUIDANCE FLUOROSCOPY TIME:  90 seconds; 11 mGy TECHNIQUE: The procedure, risks (including but not limited to bleeding, infection, organ damage ), benefits, and alternatives were explained to the patient. Questions regarding the procedure were encouraged and  answered. The patient understands and consents to the procedure. Survey ultrasound of the abdomen was performed and an appropriate skin entry site was identified. Skin site was marked, prepped with chlorhexidine, and draped in usual sterile fashion, and infiltrated locally with 1% lidocaine. Intravenous Fentanyl and Versed were administered as conscious sedation during continuous monitoring of the patient's level of consciousness and physiological / cardiorespiratory status by the radiology RN, with a total moderate sedation time of 12 minutes. Under real-time ultrasound guidance, gallbladder was accessed using a transhepatic approach with a 21-gauge needle. Ultrasound image documentation was saved. Bile returned through the hub. Needle was exchanged over a 018 guidewire for transitional dilator which allowed placement of 035 J wire. Over this, a 10.2 French pigtail catheter was advanced and formed centrally in the gallbladder lumen. 20 mL of blackish green bile were aspirated, sent for culture. Small contrast injection confirmed appropriate position. Catheter secured externally with 0 Prolene suture and placed external drain bag. Patient tolerated the procedure well. COMPLICATIONS: COMPLICATIONS none IMPRESSION: 1. Technically successful percutaneous cholecystostomy tube placement with ultrasound and fluoroscopic guidance. Electronically Signed   By: Lucrezia Europe M.D.   On: 03/16/2018 08:02   Dg Chest Port 1 View  Result Date: 03/17/2018 CLINICAL DATA:  ARDS EXAM: PORTABLE CHEST 1 VIEW COMPARISON:  03/16/2018, 02/24/2018 FINDINGS: Endotracheal tube tip is 4.4 cm superior to the carina. Bilateral central venous catheter tips over the SVC. Esophageal tube is looped within the stomach. Mild cardiomegaly. Similar appearance of multifocal ground-glass opacities within the right greater than left lungs. IMPRESSION: 1. Support lines and tubes as above. 2. Overall no significant change in multifocal bilateral  consolidations and ground-glass opacities as compared with most recent prior radiograph Electronically Signed   By: Donavan Foil M.D.   On: 03/17/2018 03:32   Dg Chest Port 1 View  Result Date: 03/16/2018 CLINICAL DATA:  Central line placement. EXAM: PORTABLE CHEST 1 VIEW COMPARISON:  Chest radiograph earlier this day. FINDINGS: New right internal jugular central venous catheter tip projects over the mid SVC. No pneumothorax. Unchanged left internal jugular central venous catheter tip projecting over the lower SVC. Endotracheal tube tip at the level of the clavicles 5.4 cm from the carina. Enteric tube in place with tip and side-port below the diaphragm in the stomach. Development of retrocardiac opacities since prior exam. Slight worsening patchy opacities in the right lung. Unchanged heart size and mediastinal contours. No pneumothorax. IMPRESSION: 1. New right internal jugular central venous catheter tip projects over the mid SVC. Support apparatus otherwise unchanged. 2. Development of retrocardiac opacities, may be atelectasis, aspiration or pneumonia. Slight worsening patchy opacities throughout the  right lung. Electronically Signed   By: Keith Rake M.D.   On: 03/16/2018 21:30   Portable Chest X-ray  Result Date: 03/16/2018 CLINICAL DATA:  Intubated EXAM: PORTABLE CHEST 1 VIEW COMPARISON:  02/22/2018 FINDINGS: Endotracheal tube terminates 4.5 cm above the carina. Left IJ venous catheter terminates cavoatrial junction. Enteric tube courses into the proximal stomach. Defibrillator pads overlying the left hemithorax. Multifocal patchy opacities in the right upper lobe, new, suspicious for pneumonia versus aspiration. Left lung is essentially clear. No pleural effusion or pneumothorax. Cardiomegaly. IMPRESSION: Endotracheal tube terminates 4.5 cm above the carina. Additional support apparatus as above. Multifocal patchy right upper lobe opacities, new, suspicious for pneumonia versus aspiration.  Electronically Signed   By: Julian Hy M.D.   On: 03/16/2018 16:08   Dg Abd Acute W/chest  Result Date: 03/06/2018 CLINICAL DATA:  Left upper quadrant pain, distention, and nausea and vomiting beginning today. Fever. EXAM: DG ABDOMEN ACUTE W/ 1V CHEST COMPARISON:  None. FINDINGS: There is no evidence of dilated bowel loops or free intraperitoneal air. No radiopaque calculi or other significant radiographic abnormality is seen. Peripheral vascular calcification noted. Low lung volumes are noted, with mild bibasilar atelectasis. No evidence of pulmonary consolidation or edema. No evidence of pleural effusion. Heart size is normal. IMPRESSION: 1. Unremarkable bowel gas pattern. 2. Low lung volumes with mild bibasilar atelectasis. Electronically Signed   By: Earle Gell M.D.   On: 03/03/2018 18:10   US Abdomen Limited Ruq  Result Date: 03/17/2018 CLINICAL DATA:  78 year old female with right upper quadrant abdominal pain status post percutaneous cholecystostomy tube yesterday. EXAM: ULTRASOUND ABDOMEN LIMITED RIGHT UPPER QUADRANT COMPARISON:  03/16/2018 and earlier. FINDINGS: Gallbladder: The cholecystostomy tube is visible within the lumen on image 4. There is echogenic debris within the lumen. The gallbladder wall is thickened up to 6 millimeters and there is a small volume of pericholecystic fluid (image 7). Common bile duct: Diameter: 6 millimeters, upper limits of normal. Liver: No focal lesion identified. Within normal limits in parenchymal echogenicity. No intrahepatic biliary ductal dilatation. Portal vein is patent on color Doppler imaging with normal direction of blood flow towards the liver. Other findings: Right pleural effusion visible on image 22. Negative visible right kidney. IMPRESSION: 1. Cholecystostomy tube visible within the gallbladder lumen. Gallbladder wall thickening, small volume pericholecystic fluid, and continued hemorrhage or debris within the gallbladder. 2. CBD at the upper  limits of normal. No intrahepatic biliary ductal dilatation. 3. Right pleural effusion. Electronically Signed   By: Genevie Ann M.D.   On: 03/17/2018 07:10      Kalman Drape , [facility] 03/17/2018, 8:31 AM  Pager: [phone] Mon-Wed, Friday 7:00am-4:30pm Thurs 7am-11:30am  Consults: [phone]

## 2018-03-17 NOTE — Progress Notes (Signed)
CRITICAL VALUE ALERT  Critical Value:  Calcium 6.1  Date & Time Notied:  03/17/18 1717  Provider Notified: Yes  Orders Received/Actions taken: Orders pending

## 2018-03-17 NOTE — Progress Notes (Signed)
Attempted echo at 1:10 pm. Too difficult to access room at time. Spoke to nurse. Suggested attempting in AM.

## 2018-03-17 NOTE — Progress Notes (Signed)
[facility] NEPHROLOGY PROGRESS NOTE  Assessment/ Plan: Pt is a 78 y.o. yo female hypertension, DM, CKD stage III with hemorrhagic cholecystitis status post drain, hypotension and respiratory failure on mechanical intubation, now with worsening renal failure.  #Acute kidney injury on CKD likely due to ATN in the setting of septic shock and ongoing hypotension requiring pressors: Remains anuric with worsening acidosis.  Started CRRT on 03/16/2018 with no net UF.  On sodium bicarbonate.  Post filter fluid.  Monitor urine output, I will order ultrasound kidney. -Avoid IV contrast, nephrotoxins  #Septic shock: On antibiotics, supervision and Levophed.  Monitor blood pressure.  Blood pressure marginal.  #Hemorrhagic cholecystitis: Surgery is following.  #Ventilator dependent respiratory failure per PCCM  #Metabolic acidosis: Continue bicarb  #Acute blood loss anemia  #Transaminitis with hemorrhagic gallbladder and shock  Subjective: Seen and examined ICU.  On pressors, intubated.  CRRT.  Requiring high dose of pressors.  No net ultrafiltration. Objective Vital signs in last 24 hours: Vitals:   03/17/18 0820 03/17/18 0830 03/17/18 0845 03/17/18 0900  BP:      Pulse: 72 71 74   Resp:  (!) 27 (!) 22 19  Temp:  (!) 94.3 F (34.6 C) (!) 94.3 F (34.6 C) (!) 94.5 F (34.7 C)  TempSrc:      SpO2:  100% 97%   Weight:      Height:       Weight change: 5.396 kg  Intake/Output Summary (Last 24 hours) at 03/17/2018 0937 Last data filed at 03/17/2018 0900 Gross per 24 hour  Intake 6505.25 ml  Output 4228 ml  Net 2277.25 ml       Labs: Basic Metabolic Panel: Recent Labs  Lab 03/16/18 1157  03/17/18 0408 03/17/18 0434 03/17/18 0435  NA 135   < > 137 139 139  K 4.5   < > 3.9 4.1 4.1  CL 109  --   --  96* 97*  CO2 12*  --   --  11* 11*  GLUCOSE 72  --   --  90 89  BUN 48*  --   --  32* 32*  CREATININE 3.58*  --   --  3.08* 2.99*  CALCIUM 7.3*  --   --  6.4* 6.4*   PHOS  --   --   --  6.5* 6.6*   < > = values in this interval not displayed.   Liver Function Tests: Recent Labs  Lab 03/02/2018 1704 03/16/18 0529 03/17/18 0434 03/17/18 0435  AST 4,149* 8,024* 5,342*  --   ALT 1,913* 3,038* 3,682*  --   ALKPHOS 285* 237* 211*  --   BILITOT 1.4* 1.7* 3.5*  --   PROT 7.1 5.4* 5.0*  --   ALBUMIN 3.8 2.6* 2.3* 2.3*   Recent Labs  Lab 02/28/2018 1704  LIPASE 69*   Recent Labs  Lab 03/17/18 0434  AMMONIA 57*   CBC: Recent Labs  Lab 03/04/2018 1704 03/16/18 0239  03/16/18 0529 03/16/18 1157 03/17/18 0224 03/17/18 0408 03/17/18 0434  WBC 1.0* 4.7  --  7.6 10.2  --   --  17.9*  NEUTROABS 0.7*  --   --   --   --   --   --   --   HGB 10.7* 8.5*  --  9.1* 8.8* 8.2* 11.2* 8.4*  HCT 33.6* 27.6*  --  28.7* 28.0* 24.0* 33.0* 27.7*  MCV 92.6 95.5  --  94.7 94.6  --   --  94.5  PLT 404* 313   < > 343 287  --   --  160   < > = values in this interval not displayed.   Cardiac Enzymes: Recent Labs  Lab 03/12/2018 1704  TROPONINI <0.03   CBG: Recent Labs  Lab 03/16/18 1613 03/16/18 2013 03/16/18 2306 03/17/18 0431 03/17/18 0741  GLUCAP 52* 83 84 86 70    Iron Studies: No results for input(s): IRON, TIBC, TRANSFERRIN, FERRITIN in the last 72 hours. Studies/Results: Ct Abdomen Pelvis Wo Contrast  Result Date: 03/05/2018 CLINICAL DATA:  Left upper quadrant abdominal pain and nausea and vomiting that started today. EXAM: CT ABDOMEN AND PELVIS WITHOUT CONTRAST TECHNIQUE: Multidetector CT imaging of the abdomen and pelvis was performed following the standard protocol without IV contrast. COMPARISON:  CT scan and 08/21/2012 FINDINGS: Lower chest: Bibasilar scarring and atelectasis. No infiltrates or effusions. Heart is borderline enlarged. Three-vessel coronary artery calcifications are noted. Hepatobiliary: No focal hepatic lesions are identified without contrast. The gallbladder is markedly distended and contains high attenuation material worrisome  for hemorrhage. This measures approximately 68 Hounsfield units and I think sludge is un likely. Vicarious excretion of contrast material is also possible if the patient has had a recent contrast enhanced scan some rales. Right upper quadrant ultrasound examination may be helpful for further evaluation. No intra or extrahepatic biliary dilatation. Pancreas: No mass, inflammation or ductal dilatation. Spleen: Normal size.  No focal lesions. Adrenals/Urinary Tract: The bladder is grossly normal. Stomach/Bowel: The stomach, duodenum, small bowel and colon are grossly normal. No acute inflammatory process, mass lesions or obstructive findings. The appendix is not identified for certain and may be surgically absent. Vascular/Lymphatic: Advanced atherosclerotic calcifications involving the aorta and iliac arteries and femoral arteries. Left common iliac artery stent is noted. No mesenteric or retroperitoneal mass or adenopathy. Reproductive: Surgically absent. Other: No pelvic mass or adenopathy. No free pelvic fluid collections. No inguinal mass or adenopathy. No abdominal wall hernia or subcutaneous lesions. Musculoskeletal: No significant bony findings. IMPRESSION: 1. Distended gallbladder with high attenuation material highly suspicious for hemorrhagic cholecystitis. Recommend clinical correlation. Right upper quadrant ultrasound examination may be helpful for further evaluation. 2. Small bilateral adrenal gland adenomas. 3. Severe atherosclerotic calcifications involving the aorta and iliac arteries and branch vessels. There also three-vessel coronary artery calcifications. Electronically Signed   By: Marijo Sanes M.D.   On: 03/13/2018 20:22   Ir Perc Cholecystostomy  Result Date: 03/16/2018 CLINICAL DATA:  Acute hemorrhagic cholecystitis, sepsis EXAM: PERCUTANEOUS CHOLECYSTOSTOMY TUBE PLACEMENT WITH ULTRASOUND AND FLUOROSCOPIC GUIDANCE FLUOROSCOPY TIME:  90 seconds; 11 mGy TECHNIQUE: The procedure, risks  (including but not limited to bleeding, infection, organ damage ), benefits, and alternatives were explained to the patient. Questions regarding the procedure were encouraged and answered. The patient understands and consents to the procedure. Survey ultrasound of the abdomen was performed and an appropriate skin entry site was identified. Skin site was marked, prepped with chlorhexidine, and draped in usual sterile fashion, and infiltrated locally with 1% lidocaine. Intravenous Fentanyl and Versed were administered as conscious sedation during continuous monitoring of the patient's level of consciousness and physiological / cardiorespiratory status by the radiology RN, with a total moderate sedation time of 12 minutes. Under real-time ultrasound guidance, gallbladder was accessed using a transhepatic approach with a 21-gauge needle. Ultrasound image documentation was saved. Bile returned through the hub. Needle was exchanged over a 018 guidewire for transitional dilator which allowed placement of 035 J wire. Over this, a 10.2 [place]  pigtail catheter was advanced and formed centrally in the gallbladder lumen. 20 mL of blackish green bile were aspirated, sent for culture. Small contrast injection confirmed appropriate position. Catheter secured externally with 0 Prolene suture and placed external drain bag. Patient tolerated the procedure well. COMPLICATIONS: COMPLICATIONS none IMPRESSION: 1. Technically successful percutaneous cholecystostomy tube placement with ultrasound and fluoroscopic guidance. Electronically Signed   By: Lucrezia Europe M.D.   On: 03/16/2018 08:02   Dg Chest Port 1 View  Result Date: 03/17/2018 CLINICAL DATA:  ARDS EXAM: PORTABLE CHEST 1 VIEW COMPARISON:  03/16/2018, 03/03/2018 FINDINGS: Endotracheal tube tip is 4.4 cm superior to the carina. Bilateral central venous catheter tips over the SVC. Esophageal tube is looped within the stomach. Mild cardiomegaly. Similar appearance of multifocal  ground-glass opacities within the right greater than left lungs. IMPRESSION: 1. Support lines and tubes as above. 2. Overall no significant change in multifocal bilateral consolidations and ground-glass opacities as compared with most recent prior radiograph Electronically Signed   By: Donavan Foil M.D.   On: 03/17/2018 03:32   Dg Chest Port 1 View  Result Date: 03/16/2018 CLINICAL DATA:  Central line placement. EXAM: PORTABLE CHEST 1 VIEW COMPARISON:  Chest radiograph earlier this day. FINDINGS: New right internal jugular central venous catheter tip projects over the mid SVC. No pneumothorax. Unchanged left internal jugular central venous catheter tip projecting over the lower SVC. Endotracheal tube tip at the level of the clavicles 5.4 cm from the carina. Enteric tube in place with tip and side-port below the diaphragm in the stomach. Development of retrocardiac opacities since prior exam. Slight worsening patchy opacities in the right lung. Unchanged heart size and mediastinal contours. No pneumothorax. IMPRESSION: 1. New right internal jugular central venous catheter tip projects over the mid SVC. Support apparatus otherwise unchanged. 2. Development of retrocardiac opacities, may be atelectasis, aspiration or pneumonia. Slight worsening patchy opacities throughout the right lung. Electronically Signed   By: Keith Rake M.D.   On: 03/16/2018 21:30   Portable Chest X-ray  Result Date: 03/16/2018 CLINICAL DATA:  Intubated EXAM: PORTABLE CHEST 1 VIEW COMPARISON:  03/03/2018 FINDINGS: Endotracheal tube terminates 4.5 cm above the carina. Left IJ venous catheter terminates cavoatrial junction. Enteric tube courses into the proximal stomach. Defibrillator pads overlying the left hemithorax. Multifocal patchy opacities in the right upper lobe, new, suspicious for pneumonia versus aspiration. Left lung is essentially clear. No pleural effusion or pneumothorax. Cardiomegaly. IMPRESSION: Endotracheal tube  terminates 4.5 cm above the carina. Additional support apparatus as above. Multifocal patchy right upper lobe opacities, new, suspicious for pneumonia versus aspiration. Electronically Signed   By: Julian Hy M.D.   On: 03/16/2018 16:08   Dg Abd Acute W/chest  Result Date: 02/17/2018 CLINICAL DATA:  Left upper quadrant pain, distention, and nausea and vomiting beginning today. Fever. EXAM: DG ABDOMEN ACUTE W/ 1V CHEST COMPARISON:  None. FINDINGS: There is no evidence of dilated bowel loops or free intraperitoneal air. No radiopaque calculi or other significant radiographic abnormality is seen. Peripheral vascular calcification noted. Low lung volumes are noted, with mild bibasilar atelectasis. No evidence of pulmonary consolidation or edema. No evidence of pleural effusion. Heart size is normal. IMPRESSION: 1. Unremarkable bowel gas pattern. 2. Low lung volumes with mild bibasilar atelectasis. Electronically Signed   By: Earle Gell M.D.   On: 03/01/2018 18:10   US Abdomen Limited Ruq  Result Date: 03/17/2018 CLINICAL DATA:  78 year old female with right upper quadrant abdominal pain status post percutaneous cholecystostomy tube  yesterday. EXAM: ULTRASOUND ABDOMEN LIMITED RIGHT UPPER QUADRANT COMPARISON:  03/16/2018 and earlier. FINDINGS: Gallbladder: The cholecystostomy tube is visible within the lumen on image 4. There is echogenic debris within the lumen. The gallbladder wall is thickened up to 6 millimeters and there is a small volume of pericholecystic fluid (image 7). Common bile duct: Diameter: 6 millimeters, upper limits of normal. Liver: No focal lesion identified. Within normal limits in parenchymal echogenicity. No intrahepatic biliary ductal dilatation. Portal vein is patent on color Doppler imaging with normal direction of blood flow towards the liver. Other findings: Right pleural effusion visible on image 22. Negative visible right kidney. IMPRESSION: 1. Cholecystostomy tube visible  within the gallbladder lumen. Gallbladder wall thickening, small volume pericholecystic fluid, and continued hemorrhage or debris within the gallbladder. 2. CBD at the upper limits of normal. No intrahepatic biliary ductal dilatation. 3. Right pleural effusion. Electronically Signed   By: Genevie Ann M.D.   On: 03/17/2018 07:10    Medications: Infusions: . sodium chloride 10 mL/hr at 03/16/18 1000  . sodium chloride    . angiotensin II (GIAPREZA) infusion 40 ng/kg/min (03/17/18 0900)  . dexmedetomidine (PRECEDEX) IV infusion 0.8 mcg/kg/hr (03/17/18 0900)  . dextrose 5 % and 0.45% NaCl 125 mL/hr at 03/17/18 0900  . fentaNYL infusion INTRAVENOUS 300 mcg/hr (03/17/18 0900)  . norepinephrine (LEVOPHED) Adult infusion 29 mcg/min (03/17/18 0900)  . piperacillin-tazobactam    . prismasol BGK 4/2.5 2,000 mL/hr at 03/17/18 0742  .  sodium bicarbonate (isotonic) infusion in sterile water 125 mL/hr at 03/17/18 0900  . sodium bicarbonate (isotonic) 1000 mL infusion 200 mL/hr at 03/17/18 0615  . sodium bicarbonate (isotonic) 1000 mL infusion 200 mL/hr at 03/17/18 0615  . vasopressin (PITRESSIN) infusion - *FOR SHOCK* 0.03 Units/min (03/17/18 0900)    Scheduled Medications: . chlorhexidine gluconate (MEDLINE KIT)  15 mL Mouth Rinse BID  . fentaNYL (SUBLIMAZE) injection  50 mcg Intravenous Once  . hydrocortisone sod succinate (SOLU-CORTEF) inj  50 mg Intravenous Q6H  . mouth rinse  15 mL Mouth Rinse 10 times per day  . sodium chloride flush  3 mL Intravenous Once  . sodium chloride flush  5 mL Intracatheter Q8H    have reviewed scheduled and prn medications.  Physical Exam: General: Debated, not in distress Heart:RRR, s1s2 nl, no rubs Lungs: Coarse breath sound bilateral, no wheezing Abdomen:soft,  non-distended Extremities:No edema Dialysis Access: IJ temporary catheter   Prasad  03/17/2018,9:37 AM  LOS: 2 days

## 2018-03-17 NOTE — Progress Notes (Signed)
eLink Physician-Brief Progress Note Patient Name: Caitlin Yates DOB: 07-10-1940 MRN: [id number]   Date of Service  03/17/2018  HPI/Events of Note  Asking for bear hugger, on CRRT. Temp gets low  eICU Interventions  Bear hugger ordered.      Intervention Category Minor Interventions: Routine modifications to care plan (e.g. PRN medications for pain, fever)  Elmer Sow 03/17/2018, 1:25 AM

## 2018-03-17 NOTE — Progress Notes (Signed)
eLink Physician-Brief Progress Note Patient Name: Caitlin Yates DOB: [birth year] MRN: [id number]   Date of Service  03/17/2018  HPI/Events of Note  ABG notifed. P/F ratio low < 100. Getting very high VT > 9 /ibw. ibw around 57. Camera: VS stable but on 3 pressors/CRRT.  Discussed with bed side RN.   eICU Interventions  Vent changed to 450/12/30/100%. Follow ABG in 30 mins and goal to wean Vt 63ml/ibw. Follow CxR for any worsening of hemothorax.      Intervention Category Major Interventions: Acid-Base disturbance - evaluation and management;Respiratory failure - evaluation and management  Elmer Sow 03/17/2018, 3:08 AM

## 2018-03-17 NOTE — Progress Notes (Signed)
eLink Physician-Brief Progress Note Patient Name: HILARIE SINHA DOB: [birth year] MRN: [id number]   Date of Service  03/17/2018  HPI/Events of Note  Low calcium.   eICU Interventions  Calcium gluconate 1 gm IV     Intervention Category Intermediate Interventions: Electrolyte abnormality - evaluation and management  Elmer Sow 03/17/2018, 6:30 AM

## 2018-03-18 ENCOUNTER — Inpatient Hospital Stay (HOSPITAL_COMMUNITY): Payer: Medicare Other

## 2018-03-18 LAB — POCT I-STAT 7, (LYTES, BLD GAS, ICA,H+H)
Acid-base deficit: 3 mmol/L — ABNORMAL HIGH (ref 0.0–2.0)
Bicarbonate: 20.7 mmol/L (ref 20.0–28.0)
Calcium, Ion: 0.63 mmol/L — CL (ref 1.15–1.40)
HCT: 25 % — ABNORMAL LOW (ref 36.0–46.0)
Hemoglobin: 8.5 g/dL — ABNORMAL LOW (ref 12.0–15.0)
O2 Saturation: 99 %
PCO2 ART: 29.6 mmHg — AB (ref 32.0–48.0)
PO2 ART: 110 mmHg — AB (ref 83.0–108.0)
Patient temperature: 97.3
Potassium: 4.9 mmol/L (ref 3.5–5.1)
Sodium: 131 mmol/L — ABNORMAL LOW (ref 135–145)
TCO2: 22 mmol/L (ref 22–32)
pH, Arterial: 7.449 (ref 7.350–7.450)

## 2018-03-18 LAB — RENAL FUNCTION PANEL
ANION GAP: 31 — AB (ref 5–15)
ANION GAP: 31 — AB (ref 5–15)
Albumin: 2.4 g/dL — ABNORMAL LOW (ref 3.5–5.0)
Albumin: 2.4 g/dL — ABNORMAL LOW (ref 3.5–5.0)
BUN: 17 mg/dL (ref 8–23)
BUN: 13 mg/dL (ref 8–23)
CALCIUM: 6.1 mg/dL — AB (ref 8.9–10.3)
CO2: 18 mmol/L — ABNORMAL LOW (ref 22–32)
CO2: 8 mmol/L — ABNORMAL LOW (ref 22–32)
Calcium: 6.4 mg/dL — CL (ref 8.9–10.3)
Chloride: 86 mmol/L — ABNORMAL LOW (ref 98–111)
Chloride: 94 mmol/L — ABNORMAL LOW (ref 98–111)
Creatinine, Ser: 2.09 mg/dL — ABNORMAL HIGH (ref 0.44–1.00)
Creatinine, Ser: 1.75 mg/dL — ABNORMAL HIGH (ref 0.44–1.00)
GFR calc Af Amer: 32 mL/min — ABNORMAL LOW (ref >60)
GFR calc non Af Amer: 22 mL/min — ABNORMAL LOW (ref >60)
GFR, EST AFRICAN AMERICAN: 25 mL/min — AB (ref >60)
GFR, EST NON AFRICAN AMERICAN: 27 mL/min — AB (ref >60)
Glucose, Bld: 90 mg/dL (ref 70–99)
Glucose, Bld: 52 mg/dL — ABNORMAL LOW (ref 70–99)
Phosphorus: 5.6 mg/dL — ABNORMAL HIGH (ref 2.5–4.6)
Phosphorus: 7.7 mg/dL — ABNORMAL HIGH (ref 2.5–4.6)
Potassium: 5.1 mmol/L (ref 3.5–5.1)
Potassium: 6.6 mmol/L (ref 3.5–5.1)
Sodium: 135 mmol/L (ref 135–145)
Sodium: 133 mmol/L — ABNORMAL LOW (ref 135–145)

## 2018-03-18 LAB — BLOOD GAS, ARTERIAL
Acid-base deficit: 10.9 mmol/L — ABNORMAL HIGH (ref 0.0–2.0)
Bicarbonate: 13.9 mmol/L — ABNORMAL LOW (ref 20.0–28.0)
FIO2: 0.5
MECHVT: 480 mL
O2 Saturation: 95.4 %
PCO2 ART: 27.3 mmHg — AB (ref 32.0–48.0)
PEEP: 5 cmH2O
Patient temperature: 98.6
RATE: 30 resp/min
pH, Arterial: 7.327 — ABNORMAL LOW (ref 7.350–7.450)
pO2, Arterial: 94.7 mmHg (ref 83.0–108.0)

## 2018-03-18 LAB — CBC
HCT: 25.5 % — ABNORMAL LOW (ref 36.0–46.0)
HEMOGLOBIN: 8.5 g/dL — AB (ref 12.0–15.0)
MCH: 29.9 pg (ref 26.0–34.0)
MCHC: 33.3 g/dL (ref 30.0–36.0)
MCV: 89.8 fL (ref 80.0–100.0)
Platelets: 136 10*3/uL — ABNORMAL LOW (ref 150–400)
RBC: 2.84 MIL/uL — ABNORMAL LOW (ref 3.87–5.11)
RDW: 14.3 % (ref 11.5–15.5)
WBC: 16.3 10*3/uL — ABNORMAL HIGH (ref 4.0–10.5)
nRBC: 1.2 % — ABNORMAL HIGH (ref 0.0–0.2)

## 2018-03-18 LAB — HEPATIC FUNCTION PANEL
ALBUMIN: 2.4 g/dL — AB (ref 3.5–5.0)
ALT: 6149 U/L — ABNORMAL HIGH (ref 0–44)
AST: >10000 U/L — ABNORMAL HIGH (ref 15–41)
Alkaline Phosphatase: 393 U/L — ABNORMAL HIGH (ref 38–126)
Bilirubin, Direct: 3.2 mg/dL — ABNORMAL HIGH (ref 0.0–0.2)
Indirect Bilirubin: 1.2 mg/dL — ABNORMAL HIGH (ref 0.3–0.9)
Total Bilirubin: 4.4 mg/dL — ABNORMAL HIGH (ref 0.3–1.2)
Total Protein: 4.9 g/dL — ABNORMAL LOW (ref 6.5–8.1)

## 2018-03-18 LAB — LACTIC ACID, PLASMA: Lactic Acid, Venous: >11 mmol/L (ref 0.5–1.9)

## 2018-03-18 LAB — URINE CULTURE: Culture: >=100000 — AB

## 2018-03-18 LAB — GLUCOSE, CAPILLARY
GLUCOSE-CAPILLARY: 110 mg/dL — AB (ref 70–99)
Glucose-Capillary: 94 mg/dL (ref 70–99)
Glucose-Capillary: 86 mg/dL (ref 70–99)
Glucose-Capillary: 81 mg/dL (ref 70–99)
Glucose-Capillary: 164 mg/dL — ABNORMAL HIGH (ref 70–99)
Glucose-Capillary: 48 mg/dL — ABNORMAL LOW (ref 70–99)

## 2018-03-18 LAB — PROTIME-INR
INR: 4.6 (ref 0.8–1.2)
Prothrombin Time: 42.5 seconds — ABNORMAL HIGH (ref 11.4–15.2)

## 2018-03-18 LAB — PHOSPHORUS: Phosphorus: 7.7 mg/dL — ABNORMAL HIGH (ref 2.5–4.6)

## 2018-03-18 LAB — MAGNESIUM
Magnesium: 2.1 mg/dL (ref 1.7–2.4)
Magnesium: 2.6 mg/dL — ABNORMAL HIGH (ref 1.7–2.4)

## 2018-03-18 MED ORDER — DEXTROSE 50 % IV SOLN
50.0000 mL | Freq: Once | INTRAVENOUS | Status: AC
  Administered 2018-03-18: 50 mL via INTRAVENOUS

## 2018-03-18 MED ORDER — PRISMASOL BGK 4/2.5 32-4-2.5 MEQ/L REPLACEMENT SOLN
Status: DC
  Administered 2018-03-18: 12:00:00 via INTRAVENOUS_CENTRAL
  Filled 2018-03-18 (×2): qty 5000

## 2018-03-18 MED ORDER — PRISMASOL BGK 0/2.5 32-2.5 MEQ/L REPLACEMENT SOLN
Status: DC
  Filled 2018-03-18 (×2): qty 5000

## 2018-03-18 MED ORDER — CALCIUM GLUCONATE-NACL 2-0.675 GM/100ML-% IV SOLN
2.0000 g | Freq: Once | INTRAVENOUS | Status: AC
  Administered 2018-03-18: 2000 mg via INTRAVENOUS
  Filled 2018-03-18: qty 100

## 2018-03-18 MED ORDER — VITAMIN K1 10 MG/ML IJ SOLN
5.0000 mg | Freq: Once | INTRAVENOUS | Status: AC
  Administered 2018-03-18: 5 mg via INTRAVENOUS
  Filled 2018-03-18: qty 0.5

## 2018-03-18 MED ORDER — VITAL HIGH PROTEIN PO LIQD
1000.0000 mL | ORAL | Status: DC
  Administered 2018-03-18: 1000 mL

## 2018-03-18 MED ORDER — DEXTROSE 50 % IV SOLN
INTRAVENOUS | Status: AC
  Filled 2018-03-18: qty 50

## 2018-03-18 MED ORDER — SODIUM CHLORIDE 0.9 % IV BOLUS
500.0000 mL | Freq: Once | INTRAVENOUS | Status: AC
  Administered 2018-03-18: 500 mL via INTRAVENOUS

## 2018-03-18 MED ORDER — SODIUM CHLORIDE 0.9 % IV BOLUS
1000.0000 mL | Freq: Once | INTRAVENOUS | Status: AC
  Administered 2018-03-18: 1000 mL via INTRAVENOUS

## 2018-03-18 MED ORDER — PRISMASOL BGK 0/2.5 32-2.5 MEQ/L IV SOLN
INTRAVENOUS | Status: DC
  Filled 2018-03-18 (×7): qty 5000

## 2018-03-18 MED ORDER — EPINEPHRINE PF 1 MG/ML IJ SOLN
0.5000 ug/min | INTRAVENOUS | Status: DC
  Administered 2018-03-18: 1 ug/min via INTRAVENOUS
  Filled 2018-03-18: qty 4

## 2018-03-18 MED ORDER — PRISMASOL BGK 0/2.5 32-2.5 MEQ/L REPLACEMENT SOLN
Status: DC
  Administered 2018-03-18: 12:00:00 via INTRAVENOUS_CENTRAL
  Filled 2018-03-18 (×3): qty 5000

## 2018-03-19 LAB — BODY FLUID CULTURE

## 2018-03-20 LAB — CULTURE, BLOOD (ROUTINE X 2)
Culture: NO GROWTH
Special Requests: ADEQUATE

## 2018-03-21 LAB — CULTURE, BLOOD (ROUTINE X 2)
Culture: NO GROWTH
SPECIAL REQUESTS: ADEQUATE

## 2018-03-24 ENCOUNTER — Telehealth: Payer: Self-pay | Admitting: *Deleted

## 2018-03-24 NOTE — Telephone Encounter (Signed)
Received Original D/C from [place] ,D/C forwarded to Dr. Halford Chessman  for [place].

## 2018-03-25 NOTE — Telephone Encounter (Signed)
Received signed original D/C-D/C mailed to [facility] as requested by [facility] home.

## 2018-03-28 ENCOUNTER — Other Ambulatory Visit: Payer: Self-pay | Admitting: Cardiology

## 2018-04-16 NOTE — Progress Notes (Signed)
[facility] NEPHROLOGY PROGRESS NOTE  Assessment/ Plan: Pt is a 78 y.o. yo female hypertension, DM, CKD stage III with hemorrhagic cholecystitis status post drain, hypotension and respiratory failure on mechanical intubation, now with worsening renal failure.  #Acute kidney injury on CKD likely due to ATN in the setting of septic shock and ongoing hypotension requiring pressors: Remains anuric with no sign of renal recovery.  Started CRRT on 03/16/2018 with no net UF.  Patient is currently on maximum amount of pressors.  Changed pre-and post filter fluid to PrismaSol today because of liver disease.  Replating calcium.  Monitor electrolytes and urine output. -Kidney ultrasound with mild increase in echogenicity without obstruction. -Avoid IV contrast, nephrotoxins  #Septic shock: On antibiotics, vasopressin and Levophed.  Blood pressure is marginal.  #Transaminitis and hemorrhagic cholecystitis: Surgery is following.  Liver enzymes are worsened.  #Ventilator dependent respiratory failure per PCCM  #Metabolic acidosis: Improving.  #Acute blood loss anemia  Subjective: Seen and examined ICU.  Remains intubated, on pressors.  No clinical improvement.  On CRRT with no net UF. Objective Vital signs in last 24 hours: Vitals:   04-02-18 0945 04/02/18 1000 Apr 02, 2018 1015 04/02/18 1030  BP:  (!) 94/58    Pulse:    95  Resp: (!) 30 (!) 28 (!) 26 (!) 28  Temp: (!) 95.4 F (35.2 C) (!) 95.5 F (35.3 C) (!) 95.5 F (35.3 C) (!) 95.7 F (35.4 C)  TempSrc:      SpO2:    (!) 63%  Weight:      Height:       Weight change: 2.5 kg  Intake/Output Summary (Last 24 hours) at 04/02/18 1050 Last data filed at 02-Apr-2018 1023 Gross per 24 hour  Intake 9341.52 ml  Output 9347 ml  Net -5.48 ml       Labs: Basic Metabolic Panel: Recent Labs  Lab 03/17/18 0435 03/17/18 1015 03/17/18 1615 Apr 02, 2018 0321 04-02-18 0500  NA 139  --  137 131* 135  K 4.1  --  4.3 4.9 5.1  CL 97*   --  89*  --  86*  CO2 11*  --  16*  --  18*  GLUCOSE 89  --  98  --  90  BUN 32*  --  22  --  17  CREATININE 2.99*  --  2.45*  --  2.09*  CALCIUM 6.4*  --  6.1*  --  6.1*  PHOS 6.6* 5.8* 5.2*  5.3*  --  5.6*   Liver Function Tests: Recent Labs  Lab 03/16/18 0529 03/17/18 0434 03/17/18 0435 03/17/18 1615 04/02/2018 0500  AST 8,024* 5,342*  --   --  >10,000*  ALT 3,038* 3,682*  --   --  6,149*  ALKPHOS 237* 211*  --   --  393*  BILITOT 1.7* 3.5*  --   --  4.4*  PROT 5.4* 5.0*  --   --  4.9*  ALBUMIN 2.6* 2.3* 2.3* 2.6* 2.4*  2.4*   Recent Labs  Lab 03/09/2018 1704  LIPASE 69*   Recent Labs  Lab 03/17/18 0434  AMMONIA 57*   CBC: Recent Labs  Lab 03/09/2018 1704 03/16/18 0239  03/16/18 0529 03/16/18 1157  03/17/18 0434 04-02-18 0321 Apr 02, 2018 0500  WBC 1.0* 4.7  --  7.6 10.2  --  17.9*  --  16.3*  NEUTROABS 0.7*  --   --   --   --   --   --   --   --  HGB 10.7* 8.5*  --  9.1* 8.8*   < > 8.4* 8.5* 8.5*  HCT 33.6* 27.6*  --  28.7* 28.0*   < > 27.7* 25.0* 25.5*  MCV 92.6 95.5  --  94.7 94.6  --  94.5  --  89.8  PLT 404* 313   < > 343 287  --  160  --  136*   < > = values in this interval not displayed.   Cardiac Enzymes: Recent Labs  Lab 02/23/2018 1704  TROPONINI <0.03   CBG: Recent Labs  Lab 03/17/18 1931 03/17/18 2347 24-Mar-2018 0055 03-24-18 0358 2018/03/24 0736  GLUCAP 87 61* 110* 94 86    Iron Studies: No results for input(s): IRON, TIBC, TRANSFERRIN, FERRITIN in the last 72 hours. Studies/Results: US Renal  Result Date: 03/17/2018 CLINICAL DATA:  Acute kidney injury.  Diabetes. EXAM: RENAL / URINARY TRACT ULTRASOUND COMPLETE COMPARISON:  Abdomen and pelvis CT dated 02/18/2018. FINDINGS: Right Kidney: Renal measurements: 10.9 x 6.0 x 5.1 cm = volume: 174 mL. Borderline echogenic. No mass or hydronephrosis seen. Left Kidney: Renal measurements: 11.9 x 5.8 x 5.5 cm = volume: 200 mL. Echogenicity near the upper limit of normal. Fetal lobulations. No mass or  hydronephrosis seen. Bladder: Not distended with a Foley catheter in place. IMPRESSION: Borderline echogenic kidneys, suggesting mild medical renal disease. Otherwise, normal examination. Electronically Signed   By: Claudie Revering M.D.   On: 03/17/2018 14:37   Dg Chest Port 1 View  Result Date: Mar 24, 2018 CLINICAL DATA:  78 year old female with respiratory failure and shortness of breath. EXAM: PORTABLE CHEST 1 VIEW COMPARISON:  03/17/2018 and earlier. FINDINGS: Portable AP semi upright view at 0539 hours. Endotracheal tube tip in good position at the level the clavicles. Enteric tube is looped in the left upper quadrant with side hole at the level of the gastric body. Stable left IJ approach central line. There is now a dual lumen right IJ approach catheter with tips at the lower innominate vein level. No pneumothorax. Normal cardiac size and mediastinal contours. Mildly improved bilateral ventilation since yesterday. No pulmonary edema or pleural effusion. Streaky residual perihilar and basilar opacity. Paucity of bowel gas. IMPRESSION: 1. New dual-lumen type right IJ approach catheter with tip at the right innominate vein level. 2. Otherwise stable lines and tubes. 3. Mildly improved ventilation since yesterday with mild residual perihilar and basilar opacity. Electronically Signed   By: Genevie Ann M.D.   On: 03/24/2018 07:38   Dg Chest Port 1 View  Result Date: 03/17/2018 CLINICAL DATA:  ARDS EXAM: PORTABLE CHEST 1 VIEW COMPARISON:  03/16/2018, 03/14/2018 FINDINGS: Endotracheal tube tip is 4.4 cm superior to the carina. Bilateral central venous catheter tips over the SVC. Esophageal tube is looped within the stomach. Mild cardiomegaly. Similar appearance of multifocal ground-glass opacities within the right greater than left lungs. IMPRESSION: 1. Support lines and tubes as above. 2. Overall no significant change in multifocal bilateral consolidations and ground-glass opacities as compared with most recent prior  radiograph Electronically Signed   By: Donavan Foil M.D.   On: 03/17/2018 03:32   Dg Chest Port 1 View  Result Date: 03/16/2018 CLINICAL DATA:  Central line placement. EXAM: PORTABLE CHEST 1 VIEW COMPARISON:  Chest radiograph earlier this day. FINDINGS: New right internal jugular central venous catheter tip projects over the mid SVC. No pneumothorax. Unchanged left internal jugular central venous catheter tip projecting over the lower SVC. Endotracheal tube tip at the level of the clavicles 5.4 cm  from the carina. Enteric tube in place with tip and side-port below the diaphragm in the stomach. Development of retrocardiac opacities since prior exam. Slight worsening patchy opacities in the right lung. Unchanged heart size and mediastinal contours. No pneumothorax. IMPRESSION: 1. New right internal jugular central venous catheter tip projects over the mid SVC. Support apparatus otherwise unchanged. 2. Development of retrocardiac opacities, may be atelectasis, aspiration or pneumonia. Slight worsening patchy opacities throughout the right lung. Electronically Signed   By: Keith Rake M.D.   On: 03/16/2018 21:30   Portable Chest X-ray  Result Date: 03/16/2018 CLINICAL DATA:  Intubated EXAM: PORTABLE CHEST 1 VIEW COMPARISON:  02/25/2018 FINDINGS: Endotracheal tube terminates 4.5 cm above the carina. Left IJ venous catheter terminates cavoatrial junction. Enteric tube courses into the proximal stomach. Defibrillator pads overlying the left hemithorax. Multifocal patchy opacities in the right upper lobe, new, suspicious for pneumonia versus aspiration. Left lung is essentially clear. No pleural effusion or pneumothorax. Cardiomegaly. IMPRESSION: Endotracheal tube terminates 4.5 cm above the carina. Additional support apparatus as above. Multifocal patchy right upper lobe opacities, new, suspicious for pneumonia versus aspiration. Electronically Signed   By: Julian Hy M.D.   On: 03/16/2018 16:08   US  Abdomen Limited Ruq  Result Date: 03/17/2018 CLINICAL DATA:  78 year old female with right upper quadrant abdominal pain status post percutaneous cholecystostomy tube yesterday. EXAM: ULTRASOUND ABDOMEN LIMITED RIGHT UPPER QUADRANT COMPARISON:  03/16/2018 and earlier. FINDINGS: Gallbladder: The cholecystostomy tube is visible within the lumen on image 4. There is echogenic debris within the lumen. The gallbladder wall is thickened up to 6 millimeters and there is a small volume of pericholecystic fluid (image 7). Common bile duct: Diameter: 6 millimeters, upper limits of normal. Liver: No focal lesion identified. Within normal limits in parenchymal echogenicity. No intrahepatic biliary ductal dilatation. Portal vein is patent on color Doppler imaging with normal direction of blood flow towards the liver. Other findings: Right pleural effusion visible on image 22. Negative visible right kidney. IMPRESSION: 1. Cholecystostomy tube visible within the gallbladder lumen. Gallbladder wall thickening, small volume pericholecystic fluid, and continued hemorrhage or debris within the gallbladder. 2. CBD at the upper limits of normal. No intrahepatic biliary ductal dilatation. 3. Right pleural effusion. Electronically Signed   By: Genevie Ann M.D.   On: 03/17/2018 07:10    Medications: Infusions: .  prismasol BGK 4/2.5    . sodium chloride 10 mL/hr at 2018-04-09 0700  . sodium chloride    . angiotensin II (GIAPREZA) infusion Stopped (09-Apr-2018 0806)  . dextrose 5 % and 0.45% NaCl 125 mL/hr at 04/09/2018 1000  . fentaNYL infusion INTRAVENOUS 50 mcg/hr (2018/04/09 1000)  . norepinephrine (LEVOPHED) Adult infusion 15 mcg/min (04-09-18 1000)  . phytonadione (VITAMIN K) IV 5 mg (2018-04-09 1011)  . piperacillin-tazobactam Stopped (04/09/2018 0531)  . prismasol BGK 0/2.5    . prismasol BGK 4/2.5 2,000 mL/hr at 04/09/18 0842  . vasopressin (PITRESSIN) infusion - *FOR SHOCK* 0.03 Units/min (Apr 09, 2018 1000)    Scheduled  Medications: . B-complex with vitamin C  1 tablet Oral Daily  . chlorhexidine gluconate (MEDLINE KIT)  15 mL Mouth Rinse BID  . feeding supplement (PRO-STAT SUGAR FREE 64)  30 mL Per Tube BID  . feeding supplement (VITAL HIGH PROTEIN)  1,000 mL Per Tube Q24H  . hydrocortisone sod succinate (SOLU-CORTEF) inj  50 mg Intravenous Q6H  . insulin aspart  1-3 Units Subcutaneous Q4H  . mouth rinse  15 mL Mouth Rinse 10 times per day  .  pantoprazole (PROTONIX) IV  40 mg Intravenous Q24H  . sodium chloride flush  5 mL Intracatheter Q8H    have reviewed scheduled and prn medications.  Physical Exam: General: Intubated, sedated, not responsive Heart:RRR, s1s2 nl, no rubs Lungs: Coarse breath sound bilateral, no wheezing Abdomen:soft,  non-distended Extremities:No edema Dialysis Access: IJ temporary catheter  Caitlin Yates 04/01/2018,10:50 AM  LOS: 3 days

## 2018-04-16 NOTE — Progress Notes (Signed)
Post-mortem care complete and pt. Sent to morgue. Pt. Belongings sent home with family

## 2018-04-16 NOTE — Progress Notes (Signed)
[facility]/Trauma Progress Note      Assessment/Plan Principal Problem:   Acute cholecystitis Active Problems:   Diabetes mellitus without complication (HCC)   GERD (gastroesophageal reflux disease)   Hypertension   Metabolic acidosis   Neutropenia (HCC)   Acute lower UTI   Hypokalemia   Septic shock (HCC)   Shock (HCC)  Cholecystitis  - CT showed possible hemorrhagic cholecystitis - S/P IR perc chole drain, 03/01 with 24mL of blackish green bile aspirate  - [place] 03/02 showed cholecystostomy tube within lumen, continued hemorrhage or debris within gallbladder - continue drain Septic shock - lactic acid >11 and on pressors  - per CCM Acute hepatic failure likely 2/2 above - LFTs continue to trend up, Tbili 4.4  FEN: OG, trickle TF's VTE: SCD's, heparin for CRRT ID: Zosyn 03/02>> Follow up: TBD  DISPO: continue current management, nothing surgical at this time, trickle TF's in setting of abdominal distention with possible ileus. we will follow   LOS: 3 days    Subjective: CC: septic  Not alert or following commands. On vent. Pt will not wake.   Objective: Vital signs in last 24 hours: Temp:  [94.3 F (34.6 C)-99.3 F (37.4 C)] 95.2 F (35.1 C) (03/03 0800) Pulse Rate:  [68-91] 81 (03/03 0312) Resp:  [16-31] 30 (03/03 0800) BP: (78-108)/(37-60) 97/60 (03/03 0700) SpO2:  [94 %-100 %] 96 % (03/03 0400) Arterial Line BP: (71-134)/(35-47) 99/39 (03/03 0800) FiO2 (%):  [50 %-100 %] 50 % (03/03 0745) Weight:  [84.1 kg] 84.1 kg (03/03 0500) Last BM Date: (PTA)  Intake/Output from previous day: 03/02 0701 - 03/03 0700 In: 9536 [I.V.:8297.7; NG/GT:769.2; IV Piggyback:300.1] Out: 9745 [Drains:50] Intake/Output this shift: Total I/O In: 382.9 [I.V.:327.9; NG/GT:55] Out: 392 [Other:392]  PE: Gen: sedated, will not wake for me, ill appearing Card:  RRR Pulm:  On vent Abd: Soft, distention, no BS appreciated, per chole drain in place with  sanguinous drainage Skin: warm and dry   Anti-infectives: Anti-infectives (From admission, onward)   Start     Dose/Rate Route Frequency Ordered Stop   03/17/18 1800  piperacillin-tazobactam (ZOSYN) IVPB 3.375 g  Status:  Discontinued     3.375 g 12.5 mL/hr over 240 Minutes Intravenous Every 12 hours 03/17/18 0648 03/17/18 0701   03/17/18 1200  piperacillin-tazobactam (ZOSYN) IVPB 3.375 g     3.375 g 100 mL/hr over 30 Minutes Intravenous Every 6 hours 03/17/18 0701     03/17/18 0000  piperacillin-tazobactam (ZOSYN) IVPB 3.375 g  Status:  Discontinued     3.375 g 100 mL/hr over 30 Minutes Intravenous Every 6 hours 03/16/18 1915 03/17/18 0648   03/16/18 2200  piperacillin-tazobactam (ZOSYN) IVPB 3.375 g  Status:  Discontinued     3.375 g 100 mL/hr over 30 Minutes Intravenous Every 6 hours 03/16/18 1914 03/16/18 1915   03/16/18 1800  cefTRIAXone (ROCEPHIN) 2 g in sodium chloride 0.9 % 100 mL IVPB  Status:  Discontinued     2 g 200 mL/hr over 30 Minutes Intravenous Every 24 hours 03/16/18 1331 03/16/18 1510   03/16/18 1600  piperacillin-tazobactam (ZOSYN) IVPB 3.375 g  Status:  Discontinued     3.375 g 12.5 mL/hr over 240 Minutes Intravenous Every 8 hours 03/16/18 1510 03/16/18 1537   03/16/18 1545  piperacillin-tazobactam (ZOSYN) IVPB 3.375 g  Status:  Discontinued     3.375 g 12.5 mL/hr over 240 Minutes Intravenous Every 12 hours 03/16/18 1537 03/16/18 1913   03/16/18 1515  metroNIDAZOLE (FLAGYL) IVPB 500  mg  Status:  Discontinued     500 mg 100 mL/hr over 60 Minutes Intravenous Every 8 hours 03/16/18 1510 03/16/18 1641   03/16/18 0545  cefTRIAXone (ROCEPHIN) 1 g in sodium chloride 0.9 % 100 mL IVPB  Status:  Discontinued     1 g 200 mL/hr over 30 Minutes Intravenous Every 24 hours 03/16/18 0546 03/16/18 1331   03/06/2018 2030  piperacillin-tazobactam (ZOSYN) IVPB 3.375 g     3.375 g 100 mL/hr over 30 Minutes Intravenous  Once 03/03/2018 2025 03/02/2018 2059      Lab Results:   Recent Labs    03/17/18 0434 04-08-2018 0321 04-08-18 0500  WBC 17.9*  --  16.3*  HGB 8.4* 8.5* 8.5*  HCT 27.7* 25.0* 25.5*  PLT 160  --  136*   BMET Recent Labs    03/17/18 1615 08-Apr-2018 0321 April 08, 2018 0500  NA 137 131* 135  K 4.3 4.9 5.1  CL 89*  --  86*  CO2 16*  --  18*  GLUCOSE 98  --  90  BUN 22  --  17  CREATININE 2.45*  --  2.09*  CALCIUM 6.1*  --  6.1*   PT/INR Recent Labs    03/17/18 0434 04/08/2018 0500  LABPROT 28.4* 42.5*  INR 2.7* 4.6*   CMP     Component Value Date/Time   NA 135 08-Apr-2018 0500   K 5.1 2018-04-08 0500   CL 86 (L) 2018/04/08 0500   CO2 18 (L) 04/08/18 0500   GLUCOSE 90 04/08/18 0500   BUN 17 Apr 08, 2018 0500   CREATININE 2.09 (H) 2018/04/08 0500   CREATININE 1.20 (H) 03/09/2015 1006   CALCIUM 6.1 (LL) 04-08-2018 0500   PROT 4.9 (L) 04-08-18 0500   ALBUMIN 2.4 (L) 04-08-2018 0500   ALBUMIN 2.4 (L) 08-Apr-2018 0500   AST >10,000 (H) 04-08-18 0500   ALT 6,149 (H) April 08, 2018 0500   ALKPHOS 393 (H) 08-Apr-2018 0500   BILITOT 4.4 (H) 2018-04-08 0500   GFRNONAA 22 (L) 08-Apr-2018 0500   GFRAA 25 (L) 2018/04/08 0500   Lipase     Component Value Date/Time   LIPASE 69 (H) 02/19/2018 1704    Studies/Results: US Renal  Result Date: 03/17/2018 CLINICAL DATA:  Acute kidney injury.  Diabetes. EXAM: RENAL / URINARY TRACT ULTRASOUND COMPLETE COMPARISON:  Abdomen and pelvis CT dated 03/05/2018. FINDINGS: Right Kidney: Renal measurements: 10.9 x 6.0 x 5.1 cm = volume: 174 mL. Borderline echogenic. No mass or hydronephrosis seen. Left Kidney: Renal measurements: 11.9 x 5.8 x 5.5 cm = volume: 200 mL. Echogenicity near the upper limit of normal. Fetal lobulations. No mass or hydronephrosis seen. Bladder: Not distended with a Foley catheter in place. IMPRESSION: Borderline echogenic kidneys, suggesting mild medical renal disease. Otherwise, normal examination. Electronically Signed   By: Claudie Revering M.D.   On: 03/17/2018 14:37   Dg Chest Port  1 View  Result Date: April 08, 2018 CLINICAL DATA:  78 year old female with respiratory failure and shortness of breath. EXAM: PORTABLE CHEST 1 VIEW COMPARISON:  03/17/2018 and earlier. FINDINGS: Portable AP semi upright view at 0539 hours. Endotracheal tube tip in good position at the level the clavicles. Enteric tube is looped in the left upper quadrant with side hole at the level of the gastric body. Stable left IJ approach central line. There is now a dual lumen right IJ approach catheter with tips at the lower innominate vein level. No pneumothorax. Normal cardiac size and mediastinal contours. Mildly improved bilateral ventilation since yesterday.  No pulmonary edema or pleural effusion. Streaky residual perihilar and basilar opacity. Paucity of bowel gas. IMPRESSION: 1. New dual-lumen type right IJ approach catheter with tip at the right innominate vein level. 2. Otherwise stable lines and tubes. 3. Mildly improved ventilation since yesterday with mild residual perihilar and basilar opacity. Electronically Signed   By: Genevie Ann M.D.   On: March 19, 2018 07:38   Dg Chest Port 1 View  Result Date: 03/17/2018 CLINICAL DATA:  ARDS EXAM: PORTABLE CHEST 1 VIEW COMPARISON:  03/16/2018, 03/10/2018 FINDINGS: Endotracheal tube tip is 4.4 cm superior to the carina. Bilateral central venous catheter tips over the SVC. Esophageal tube is looped within the stomach. Mild cardiomegaly. Similar appearance of multifocal ground-glass opacities within the right greater than left lungs. IMPRESSION: 1. Support lines and tubes as above. 2. Overall no significant change in multifocal bilateral consolidations and ground-glass opacities as compared with most recent prior radiograph Electronically Signed   By: Donavan Foil M.D.   On: 03/17/2018 03:32   Dg Chest Port 1 View  Result Date: 03/16/2018 CLINICAL DATA:  Central line placement. EXAM: PORTABLE CHEST 1 VIEW COMPARISON:  Chest radiograph earlier this day. FINDINGS: New right  internal jugular central venous catheter tip projects over the mid SVC. No pneumothorax. Unchanged left internal jugular central venous catheter tip projecting over the lower SVC. Endotracheal tube tip at the level of the clavicles 5.4 cm from the carina. Enteric tube in place with tip and side-port below the diaphragm in the stomach. Development of retrocardiac opacities since prior exam. Slight worsening patchy opacities in the right lung. Unchanged heart size and mediastinal contours. No pneumothorax. IMPRESSION: 1. New right internal jugular central venous catheter tip projects over the mid SVC. Support apparatus otherwise unchanged. 2. Development of retrocardiac opacities, may be atelectasis, aspiration or pneumonia. Slight worsening patchy opacities throughout the right lung. Electronically Signed   By: Keith Rake M.D.   On: 03/16/2018 21:30   Portable Chest X-ray  Result Date: 03/16/2018 CLINICAL DATA:  Intubated EXAM: PORTABLE CHEST 1 VIEW COMPARISON:  02/28/2018 FINDINGS: Endotracheal tube terminates 4.5 cm above the carina. Left IJ venous catheter terminates cavoatrial junction. Enteric tube courses into the proximal stomach. Defibrillator pads overlying the left hemithorax. Multifocal patchy opacities in the right upper lobe, new, suspicious for pneumonia versus aspiration. Left lung is essentially clear. No pleural effusion or pneumothorax. Cardiomegaly. IMPRESSION: Endotracheal tube terminates 4.5 cm above the carina. Additional support apparatus as above. Multifocal patchy right upper lobe opacities, new, suspicious for pneumonia versus aspiration. Electronically Signed   By: Julian Hy M.D.   On: 03/16/2018 16:08   US Abdomen Limited Ruq  Result Date: 03/17/2018 CLINICAL DATA:  78 year old female with right upper quadrant abdominal pain status post percutaneous cholecystostomy tube yesterday. EXAM: ULTRASOUND ABDOMEN LIMITED RIGHT UPPER QUADRANT COMPARISON:  03/16/2018 and earlier.  FINDINGS: Gallbladder: The cholecystostomy tube is visible within the lumen on image 4. There is echogenic debris within the lumen. The gallbladder wall is thickened up to 6 millimeters and there is a small volume of pericholecystic fluid (image 7). Common bile duct: Diameter: 6 millimeters, upper limits of normal. Liver: No focal lesion identified. Within normal limits in parenchymal echogenicity. No intrahepatic biliary ductal dilatation. Portal vein is patent on color Doppler imaging with normal direction of blood flow towards the liver. Other findings: Right pleural effusion visible on image 22. Negative visible right kidney. IMPRESSION: 1. Cholecystostomy tube visible within the gallbladder lumen. Gallbladder wall thickening, small volume pericholecystic  fluid, and continued hemorrhage or debris within the gallbladder. 2. CBD at the upper limits of normal. No intrahepatic biliary ductal dilatation. 3. Right pleural effusion. Electronically Signed   By: Genevie Ann M.D.   On: 03/17/2018 07:10      Kalman Drape , [facility] Surgery 2018-04-14, 8:11 AM  Pager: [phone] Mon-Wed, Friday 7:00am-4:30pm Thurs 7am-11:30am  Consults: [phone]

## 2018-04-16 NOTE — Progress Notes (Signed)
CRITICAL VALUE ALERT  Critical Value:  Potassium 6.6. Calcium 6.4  Date & Time Notied: 03-Apr-2018 1650  Provider Notified: Bryson Corona  Orders Received/Actions taken: No orders at this time

## 2018-04-16 NOTE — Procedures (Signed)
Echo attempted. Nurse suggested attempting again 3/4 in AM.

## 2018-04-16 NOTE — Progress Notes (Signed)
Hypoglycemic Event  CBG: 48 Treatment: D50 50 mL (25 gm)  Symptoms: None  Follow-up CBG: Time:1621 CBG Result:164  Possible Reasons for Event: Unknown  Comments/MD notified:Yes    Bobby Rumpf, Delson Dulworth E

## 2018-04-16 NOTE — Progress Notes (Signed)
Patient passed with granddaughter at bedside. CCM notified and CDS. Anson Crofts, RN 2018-03-21 4:57 PM

## 2018-04-16 NOTE — Progress Notes (Signed)
Patient's daughter, Enid Derry, updated on patient's continuing decline in BP. Unable to maintain map  > 60. Third vasopressor added, epinephrine.   Anson Crofts [phone]

## 2018-04-16 NOTE — Progress Notes (Addendum)
NAME:  Caitlin Yates, MRN:  [id number], DOB:  [birth year], LOS: 3 ADMISSION DATE:  02/19/2018, CONSULTATION DATE:  03/16/2018 REFERRING MD:  Bena Sella, CHIEF COMPLAINT:  Abdominal pain  Brief History   78 yo female presented with acute abdominal pain, fever 103.5.  Found to have hemorrhagic cholecystitis and septic shock.  Past Medical History  DM, HTN, GERD, [facility]   2/29 admit to hospitalist at [facility] 3/01 transfer to [facility] ICU, increased pressor needs, VDRF, start CRRT 3/2 max dose pressors, CRRT ongoing. Code status changed to no CPR.   Consults:  IR 3/1 Gen Surg 3/1 Nephrology 3/1  Procedures:  Percutaneous cholecystostomy 3/01 >>  ETT 3/01 >>  Lt IJ CVL 3/01 >>  Rt radial A line 3/01 >>  Rt IJ HD cath 3/01 >>  Significant Diagnostic Tests:  CT abd/pelvis w/o contrast 2/29 > distended GB, atherosclerosis Ab u/s 3/02 >> GB thickening with hemorrhage/debris in GB, Rt pleural effusion US renal 3/2 > mild medical renal disease. No hydronephrosis    Micro Data:  BCx 2/29 > UCx 2/29> E. Coli >>> GB fluid 3/01>  E. Coli >>>  Antimicrobials:  Zosyn 2/29 >  Interim history/subjective:  Pressor requirements improved today. Off Epi.   Objective   Blood pressure 97/60, pulse 81, temperature (!) 95.2 F (35.1 C), resp. rate (!) 30, height 5\' 6"  (1.676 m), weight 84.1 kg, SpO2 96 %.    Vent Mode: PRVC FiO2 (%):  [60 %-100 %] 60 % Set Rate:  [30 bmp] 30 bmp Vt Set:  [480 mL] 480 mL PEEP:  [8 cmH20] 8 cmH20 Plateau Pressure:  [22 cmH20] 22 cmH20   Intake/Output Summary (Last 24 hours) at 03-31-18 0724 Last data filed at 03/31/2018 0700 Gross per 24 hour  Intake 9535.99 ml  Output 9745 ml  Net -209.01 ml   Filed Weights   03/16/18 0400 03/17/18 0500 03-31-18 0500  Weight: 77.4 kg 81.6 kg 84.1 kg    Examination: General:  Elderly acutely ill appearing female on vent Neuro:  Sedated. Intermittently follows commands HEENT:   [place]/AT, No JVD noted, PERRL. ETT in place.  Cardiovascular:  RRR, no MRG Lungs:  Clear bilateral breath sounds Abdomen:  Soft, non-distended. Chole drain with amber colored drainage.  Musculoskeletal:  No acute deformity or ROM limitation. Decreased peripheral pulses.  Skin:  Intact, MMM   Assessment & Plan:   Septic shock from acute hemorrhagic cholecystitis. Urine and Bile cultures positive for E. Coli.  - pressors to keep MAP < 60 mmHg (levo 16, giaprezza, vaso) - Stress dose steroids for relative AI - day 4 of Zosyn. Consider narrow to CTX given E. Coli, awaiting sensitivities.  - cholecystostomy per IR and surgery - Await sensitivities  - DC bicarb infusion. Remains in CRRT circuit   Acute hypoxic respiratory failure. Plan - full vent support, will wean fiO2 and PEEP as able, no plans for SBT today.  - ABG reviewed and settings adjusted - CXR in AM  Acute renal failure with ATN. CKD 3. Metabolic acidosis. Plan - Nephrology following - continue CRRT >> even fluid balance - f/u renal panel, ABG, renal u/s  Acute hepatic failure from shock and cholecystitis. With associated coagulopathy. INR 4.6  Plan - continue to follow LFT.  - Vitamin K 5mg   Anemia of critical illness. Coagulopathy from liver failure. Plan - f/u CBC, INR - transfuse for HB < 7  Acute metabolic encephalopathy. 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 Plan - SSI - dextrose in IV fluids  Hx of HTN, HLD. Plan - hold outpt norvasc, tenormin, chlorthalidone, spironolactone  Best practice:  Diet: tube feeds - at goal.  DVT prophylaxis: SCDs GI prophylaxis: Protonix Mobility: bed rest Code Status: Limited. Intubation only.  Family Communication: Family updated yesterday afternoon by Dr. Halford Chessman. No family at bedside this morning.  Disposition: ICU  Labs    CMP Latest Ref Rng & Units 2018/03/23 03-23-18 03/17/2018  Glucose 70 - 99 mg/dL  90 - 98  BUN 8 - 23 mg/dL 17 - 22  Creatinine 0.44 - 1.00 mg/dL 2.09(H) - 2.45(H)  Sodium 135 - 145 mmol/L 135 131(L) 137  Potassium 3.5 - 5.1 mmol/L 5.1 4.9 4.3  Chloride 98 - 111 mmol/L 86(L) - 89(L)  CO2 22 - 32 mmol/L 18(L) - 16(L)  Calcium 8.9 - 10.3 mg/dL 6.1(LL) - 6.1(LL)  Total Protein 6.5 - 8.1 g/dL 4.9(L) - -  Total Bilirubin 0.3 - 1.2 mg/dL 4.4(H) - -  Alkaline Phos 38 - 126 U/L 393(H) - -  AST 15 - 41 U/L >10,000(H) - -  ALT 0 - 44 U/L 6,149(H) - -   CBC Latest Ref Rng & Units 03/23/18 23-Mar-2018 03/17/2018  WBC 4.0 - 10.5 K/uL 16.3(H) - 17.9(H)  Hemoglobin 12.0 - 15.0 g/dL 8.5(L) 8.5(L) 8.4(L)  Hematocrit 36.0 - 46.0 % 25.5(L) 25.0(L) 27.7(L)  Platelets 150 - 400 K/uL 136(L) - 160   CBG (last 3)  Recent Labs    03/17/18 2347 Mar 23, 2018 0055 03/23/18 0358  GLUCAP 61* 110* 94   ABG    Component Value Date/Time   PHART 7.449 Mar 23, 2018 0321   PCO2ART 29.6 (L) 03-23-18 0321   PO2ART 110.0 (H) 03/23/2018 0321   HCO3 20.7 2018/03/23 0321   TCO2 22 03-23-18 0321   ACIDBASEDEF 3.0 (H) 2018/03/23 0321   O2SAT 99.0 Mar 23, 2018 0321   CC time 35 mins   Georgann Housekeeper, AGACNP-BC [place] Pager [phone] or [phone]  March 23, 2018 7:40 AM

## 2018-04-16 NOTE — Progress Notes (Signed)
Referring Physician(s): Dr. Halford Chessman  Supervising Physician: Daryll Brod  Patient Status:  [facility] - In-pt  Chief Complaint: Follow up percutaneous cholecystostomy placed 03/16/18 by Dr. Vernard Gambles  Subjective:  Patient intubated, sedated, on CRRT, warming blanket on. Per RN drain flushes easily but mostly puts out flush material with some blood. No family at bedside during exam.   Allergies: Lisinopril and Statins  Medications: Prior to Admission medications   Medication Sig Start Date End Date Taking? Authorizing Provider  amLODipine (NORVASC) 10 MG tablet TAKE 1 TABLET BY MOUTH ONCE DAILY. Patient taking differently: Take 10 mg by mouth every morning.  05/06/17  Yes Branch, Alphonse Guild, MD  aspirin EC 81 MG tablet Take 81 mg by mouth every morning.   Yes [provider]  atenolol-chlorthalidone (TENORETIC) 50-25 MG per tablet Take 1 tablet by mouth every morning.   Yes [provider]  atorvastatin (LIPITOR) 10 MG tablet Take 10 mg by mouth at bedtime.    Yes [provider]  clopidogrel (PLAVIX) 75 MG tablet Take 75 mg by mouth every morning.   Yes [provider]  cyclobenzaprine (FLEXERIL) 10 MG tablet Take 10 mg by mouth 3 (three) times daily as needed for muscle spasms.   Yes [provider]  DEXILANT 60 MG capsule Take 60 mg by mouth daily.  07/16/14  Yes [provider]  diclofenac sodium (VOLTAREN) 1 % GEL Apply 2 g topically 3 (three) times daily. To foot   Yes [provider]  gabapentin (NEURONTIN) 300 MG capsule Take 300 mg by mouth 3 (three) times daily.   Yes [provider]  glimepiride (AMARYL) 4 MG tablet Take 8 mg by mouth daily with breakfast.  02/04/15  Yes [provider]  HYDROcodone-acetaminophen (NORCO/VICODIN) 5-325 MG tablet Take 1 tablet by mouth every 8 (eight) hours as needed for moderate pain.    Yes [provider]  Multiple Vitamins-Minerals (CENTURY MATURE ADULT FORMULA  PO) Take 1 tablet by mouth every morning.   Yes [provider]  potassium chloride SA (K-DUR,KLOR-CON) 20 MEQ tablet Take 20 mEq by mouth daily.   Yes [provider]  predniSONE (DELTASONE) 20 MG tablet Take 20 mg by mouth daily with breakfast. 5 day course   Yes [provider]  Semaglutide,0.25 or 0.5MG /DOS, (OZEMPIC, 0.25 OR 0.5 MG/DOSE,) 2 MG/1.5ML SOPN Inject into the skin.   Yes [provider]  sitaGLIPtan-metformin (JANUMET) 50-1000 MG per tablet Take 1 tablet by mouth 2 (two) times daily.   Yes [provider]  spironolactone (ALDACTONE) 25 MG tablet TAKE (1/2) TABLET BY MOUTH ONCE DAILY. Patient taking differently: Take 12.5 mg by mouth daily.  09/04/17  Yes BranchAlphonse Guild, MD  Vitamin D, Ergocalciferol, (DRISDOL) 50000 units CAPS capsule Take 50,000 Units by mouth every Wednesday.   Yes [provider]  naproxen (NAPROSYN) 500 MG tablet Take 500 mg by mouth every 12 (twelve) hours as needed.  09/11/16   [provider]  potassium chloride (K-DUR) 10 MEQ tablet Take 1 tablet (10 mEq total) by mouth daily. Patient not taking: Reported on 03/17/2018 12/28/16   Milton Ferguson, MD  spironolactone (ALDACTONE) 25 MG tablet Take 25 mg by mouth daily.    [provider]     Vital Signs: BP 122/64   Pulse (!) 137   Temp (!) 96.8 F (36 C)   Resp (!) 32   Ht 5\' 6"  (1.676 m)   Wt 185 lb 6.5 oz (  84.1 kg)   SpO2 (!) 69%   BMI 29.93 kg/m   Physical Exam Vitals signs and nursing note reviewed.  Constitutional:      Appearance: She is ill-appearing.     Comments: Intubated, sedated, does not arouse to verbal stimuli or touch.  Cardiovascular:     Rate and Rhythm: Tachycardia present.  Pulmonary:     Comments: Ventilated Abdominal:     Palpations: Abdomen is soft.     Comments: (+) RUQ drain to gravity with ~10 cc frankly bloody OP present. Insertion site unremarkable.   Skin:    General: Skin is warm and dry.       Imaging: Ct Abdomen Pelvis Wo Contrast  Result Date: 02/24/2018 CLINICAL DATA:  Left upper quadrant abdominal pain and nausea and vomiting that started today. EXAM: CT ABDOMEN AND PELVIS WITHOUT CONTRAST TECHNIQUE: Multidetector CT imaging of the abdomen and pelvis was performed following the standard protocol without IV contrast. COMPARISON:  CT scan and 08/21/2012 FINDINGS: Lower chest: Bibasilar scarring and atelectasis. No infiltrates or effusions. Heart is borderline enlarged. Three-vessel coronary artery calcifications are noted. Hepatobiliary: No focal hepatic lesions are identified without contrast. The gallbladder is markedly distended and contains high attenuation material worrisome for hemorrhage. This measures approximately 68 Hounsfield units and I think sludge is un likely. Vicarious excretion of contrast material is also possible if the patient has had a recent contrast enhanced scan some rales. Right upper quadrant ultrasound examination may be helpful for further evaluation. No intra or extrahepatic biliary dilatation. Pancreas: No mass, inflammation or ductal dilatation. Spleen: Normal size.  No focal lesions. Adrenals/Urinary Tract: The bladder is grossly normal. Stomach/Bowel: The stomach, duodenum, small bowel and colon are grossly normal. No acute inflammatory process, mass lesions or obstructive findings. The appendix is not identified for certain and may be surgically absent. Vascular/Lymphatic: Advanced atherosclerotic calcifications involving the aorta and iliac arteries and femoral arteries. Left common iliac artery stent is noted. No mesenteric or retroperitoneal mass or adenopathy. Reproductive: Surgically absent. Other: No pelvic mass or adenopathy. No free pelvic fluid collections. No inguinal mass or adenopathy. No abdominal wall hernia or subcutaneous lesions. Musculoskeletal: No significant bony findings. IMPRESSION: 1. Distended gallbladder with high attenuation material  highly suspicious for hemorrhagic cholecystitis. Recommend clinical correlation. Right upper quadrant ultrasound examination may be helpful for further evaluation. 2. Small bilateral adrenal gland adenomas. 3. Severe atherosclerotic calcifications involving the aorta and iliac arteries and branch vessels. There also three-vessel coronary artery calcifications. Electronically Signed   By: Marijo Sanes M.D.   On: 02/24/2018 20:22   US Renal  Result Date: 03/17/2018 CLINICAL DATA:  Acute kidney injury.  Diabetes. EXAM: RENAL / URINARY TRACT ULTRASOUND COMPLETE COMPARISON:  Abdomen and pelvis CT dated 02/15/2018. FINDINGS: Right Kidney: Renal measurements: 10.9 x 6.0 x 5.1 cm = volume: 174 mL. Borderline echogenic. No mass or hydronephrosis seen. Left Kidney: Renal measurements: 11.9 x 5.8 x 5.5 cm = volume: 200 mL. Echogenicity near the upper limit of normal. Fetal lobulations. No mass or hydronephrosis seen. Bladder: Not distended with a Foley catheter in place. IMPRESSION: Borderline echogenic kidneys, suggesting mild medical renal disease. Otherwise, normal examination. Electronically Signed   By: Claudie Revering M.D.   On: 03/17/2018 14:37   Ir Perc Cholecystostomy  Result Date: 03/16/2018 CLINICAL DATA:  Acute hemorrhagic cholecystitis, sepsis EXAM: PERCUTANEOUS CHOLECYSTOSTOMY TUBE PLACEMENT WITH ULTRASOUND AND FLUOROSCOPIC GUIDANCE FLUOROSCOPY TIME:  90 seconds; 11 mGy TECHNIQUE: The procedure, risks (including but not limited to bleeding,  infection, organ damage ), benefits, and alternatives were explained to the patient. Questions regarding the procedure were encouraged and answered. The patient understands and consents to the procedure. Survey ultrasound of the abdomen was performed and an appropriate skin entry site was identified. Skin site was marked, prepped with chlorhexidine, and draped in usual sterile fashion, and infiltrated locally with 1% lidocaine. Intravenous Fentanyl and Versed were  administered as conscious sedation during continuous monitoring of the patient's level of consciousness and physiological / cardiorespiratory status by the radiology RN, with a total moderate sedation time of 12 minutes. Under real-time ultrasound guidance, gallbladder was accessed using a transhepatic approach with a 21-gauge needle. Ultrasound image documentation was saved. Bile returned through the hub. Needle was exchanged over a 018 guidewire for transitional dilator which allowed placement of 035 J wire. Over this, a 10.2 French pigtail catheter was advanced and formed centrally in the gallbladder lumen. 20 mL of blackish green bile were aspirated, sent for culture. Small contrast injection confirmed appropriate position. Catheter secured externally with 0 Prolene suture and placed external drain bag. Patient tolerated the procedure well. COMPLICATIONS: COMPLICATIONS none IMPRESSION: 1. Technically successful percutaneous cholecystostomy tube placement with ultrasound and fluoroscopic guidance. Electronically Signed   By: Lucrezia Europe M.D.   On: 03/16/2018 08:02   Dg Chest Port 1 View  Result Date: 03-19-2018 CLINICAL DATA:  78 year old female with respiratory failure and shortness of breath. EXAM: PORTABLE CHEST 1 VIEW COMPARISON:  03/17/2018 and earlier. FINDINGS: Portable AP semi upright view at 0539 hours. Endotracheal tube tip in good position at the level the clavicles. Enteric tube is looped in the left upper quadrant with side hole at the level of the gastric body. Stable left IJ approach central line. There is now a dual lumen right IJ approach catheter with tips at the lower innominate vein level. No pneumothorax. Normal cardiac size and mediastinal contours. Mildly improved bilateral ventilation since yesterday. No pulmonary edema or pleural effusion. Streaky residual perihilar and basilar opacity. Paucity of bowel gas. IMPRESSION: 1. New dual-lumen type right IJ approach catheter with tip at the  right innominate vein level. 2. Otherwise stable lines and tubes. 3. Mildly improved ventilation since yesterday with mild residual perihilar and basilar opacity. Electronically Signed   By: Genevie Ann M.D.   On: Mar 19, 2018 07:38   Dg Chest Port 1 View  Result Date: 03/17/2018 CLINICAL DATA:  ARDS EXAM: PORTABLE CHEST 1 VIEW COMPARISON:  03/16/2018, 03/01/2018 FINDINGS: Endotracheal tube tip is 4.4 cm superior to the carina. Bilateral central venous catheter tips over the SVC. Esophageal tube is looped within the stomach. Mild cardiomegaly. Similar appearance of multifocal ground-glass opacities within the right greater than left lungs. IMPRESSION: 1. Support lines and tubes as above. 2. Overall no significant change in multifocal bilateral consolidations and ground-glass opacities as compared with most recent prior radiograph Electronically Signed   By: Donavan Foil M.D.   On: 03/17/2018 03:32   Dg Chest Port 1 View  Result Date: 03/16/2018 CLINICAL DATA:  Central line placement. EXAM: PORTABLE CHEST 1 VIEW COMPARISON:  Chest radiograph earlier this day. FINDINGS: New right internal jugular central venous catheter tip projects over the mid SVC. No pneumothorax. Unchanged left internal jugular central venous catheter tip projecting over the lower SVC. Endotracheal tube tip at the level of the clavicles 5.4 cm from the carina. Enteric tube in place with tip and side-port below the diaphragm in the stomach. Development of retrocardiac opacities since prior exam. Slight worsening patchy  opacities in the right lung. Unchanged heart size and mediastinal contours. No pneumothorax. IMPRESSION: 1. New right internal jugular central venous catheter tip projects over the mid SVC. Support apparatus otherwise unchanged. 2. Development of retrocardiac opacities, may be atelectasis, aspiration or pneumonia. Slight worsening patchy opacities throughout the right lung. Electronically Signed   By: Keith Rake M.D.   On:  03/16/2018 21:30   Portable Chest X-ray  Result Date: 03/16/2018 CLINICAL DATA:  Intubated EXAM: PORTABLE CHEST 1 VIEW COMPARISON:  02/18/2018 FINDINGS: Endotracheal tube terminates 4.5 cm above the carina. Left IJ venous catheter terminates cavoatrial junction. Enteric tube courses into the proximal stomach. Defibrillator pads overlying the left hemithorax. Multifocal patchy opacities in the right upper lobe, new, suspicious for pneumonia versus aspiration. Left lung is essentially clear. No pleural effusion or pneumothorax. Cardiomegaly. IMPRESSION: Endotracheal tube terminates 4.5 cm above the carina. Additional support apparatus as above. Multifocal patchy right upper lobe opacities, new, suspicious for pneumonia versus aspiration. Electronically Signed   By: Julian Hy M.D.   On: 03/16/2018 16:08   Dg Abd Acute W/chest  Result Date: 02/16/2018 CLINICAL DATA:  Left upper quadrant pain, distention, and nausea and vomiting beginning today. Fever. EXAM: DG ABDOMEN ACUTE W/ 1V CHEST COMPARISON:  None. FINDINGS: There is no evidence of dilated bowel loops or free intraperitoneal air. No radiopaque calculi or other significant radiographic abnormality is seen. Peripheral vascular calcification noted. Low lung volumes are noted, with mild bibasilar atelectasis. No evidence of pulmonary consolidation or edema. No evidence of pleural effusion. Heart size is normal. IMPRESSION: 1. Unremarkable bowel gas pattern. 2. Low lung volumes with mild bibasilar atelectasis. Electronically Signed   By: Earle Gell M.D.   On: 03/01/2018 18:10   US Abdomen Limited Ruq  Result Date: 03/17/2018 CLINICAL DATA:  78 year old female with right upper quadrant abdominal pain status post percutaneous cholecystostomy tube yesterday. EXAM: ULTRASOUND ABDOMEN LIMITED RIGHT UPPER QUADRANT COMPARISON:  03/16/2018 and earlier. FINDINGS: Gallbladder: The cholecystostomy tube is visible within the lumen on image 4. There is echogenic  debris within the lumen. The gallbladder wall is thickened up to 6 millimeters and there is a small volume of pericholecystic fluid (image 7). Common bile duct: Diameter: 6 millimeters, upper limits of normal. Liver: No focal lesion identified. Within normal limits in parenchymal echogenicity. No intrahepatic biliary ductal dilatation. Portal vein is patent on color Doppler imaging with normal direction of blood flow towards the liver. Other findings: Right pleural effusion visible on image 22. Negative visible right kidney. IMPRESSION: 1. Cholecystostomy tube visible within the gallbladder lumen. Gallbladder wall thickening, small volume pericholecystic fluid, and continued hemorrhage or debris within the gallbladder. 2. CBD at the upper limits of normal. No intrahepatic biliary ductal dilatation. 3. Right pleural effusion. Electronically Signed   By: Genevie Ann M.D.   On: 03/17/2018 07:10    Labs:  CBC: Recent Labs    03/16/18 0529 03/16/18 1157  03/17/18 0408 03/17/18 0434 April 13, 2018 0321 04/13/18 0500  WBC 7.6 10.2  --   --  17.9*  --  16.3*  HGB 9.1* 8.8*   < > 11.2* 8.4* 8.5* 8.5*  HCT 28.7* 28.0*   < > 33.0* 27.7* 25.0* 25.5*  PLT 343 287  --   --  160  --  136*   < > = values in this interval not displayed.    COAGS: Recent Labs    03/04/2018 1704 03/16/18 0513 03/17/18 0434 04/13/18 0500  INR 1.1 1.5* 2.7* 4.6*  APTT  --  35  --   --     BMP: Recent Labs    03/17/18 0434 03/17/18 0435 03/17/18 1615 14-Apr-2018 0321 Apr 14, 2018 0500  NA 139 139 137 131* 135  K 4.1 4.1 4.3 4.9 5.1  CL 96* 97* 89*  --  86*  CO2 11* 11* 16*  --  18*  GLUCOSE 90 89 98  --  90  BUN 32* 32* 22  --  17  CALCIUM 6.4* 6.4* 6.1*  --  6.1*  CREATININE 3.08* 2.99* 2.45*  --  2.09*  GFRNONAA 14* 14* 18*  --  22*  GFRAA 16* 17* 21*  --  25*    LIVER FUNCTION TESTS: Recent Labs    03/08/2018 1704 03/16/18 0529 03/17/18 0434 03/17/18 0435 03/17/18 1615 2018/04/14 0500  BILITOT 1.4* 1.7* 3.5*  --    --  4.4*  AST 4,149* 8,024* 5,342*  --   --  >10,000*  ALT 1,913* 3,038* 3,682*  --   --  6,149*  ALKPHOS 285* 237* 211*  --   --  393*  PROT 7.1 5.4* 5.0*  --   --  4.9*  ALBUMIN 3.8 2.6* 2.3* 2.3* 2.6* 2.4*  2.4*    Assessment and Plan:  78 y/o F s/p percutaneous cholecystostomy 03/16/18 by Dr. Vernard Gambles for acute hemorrhagic cholecystitis. Patient currently sedated, intubated, ventilated, on CRRT and warming blanket.  Per I/O 50 cc last OP last 24H, RN at bedside states appears to be mostly flush material - flushes easily. On exam OP is frankly bloody. RUQ [place] 3.2 showed cholecystostomy visible within the gallbladder lumen, gallbladder wall thickening, small volume pericholecystic fluid and continued hemorrhage or debris within the gallbladder, CBD at upper limit of normal, no intrahepatic biliary ductal dilatation.  LFTs continue to worsen - AST >10,000, ALT 6,149, ALP 393, t.bili 4.4 (1.4 at time of cholecystostomy placement), direct bili 3.2, indirect bili 1.2. Lactic acid remains >11. WBC 16.3, hgb 8.5, plt 136. Creatinine improved to 2.09. Preliminary cx (+) for e.coli - she continues on Zosyn per primary team. BP continues to decline - third vasopressor added today.   Continue TID flushes with 3-5 cc NS, record drain output daily. IR will continue to follow.  Please call with questions or concerns.    Electronically Signed: Joaquim Nam, PA-C 2018/04/14, 4:01 PM   I spent a total of 15 Minutes at the the patient's bedside AND on the patient's hospital floor or unit, greater than 50% of which was counseling/coordinating care for cholecystostomy follow up.

## 2018-04-16 NOTE — Progress Notes (Signed)
CRITICAL VALUE ALERT  Critical Value:  Lactic acid > 11  Date & Time Notied:  March 21, 2018  Provider Notified: Yes  Orders Received/Actions taken: No orders at this time.

## 2018-04-16 NOTE — Progress Notes (Signed)
  Wasted 168ml of fentanyl in appropriate receptacle with Anson Crofts. Bartholomew Crews, RN 04-10-2018 5:22 PM

## 2018-04-16 NOTE — Death Summary Note (Signed)
Caitlin Yates was a 78 y.o. female admitted on 03/10/2018 with abdominal pain and fever.  Found to have septic shock from hemorrhagic cholecystitis.  Started on antibiotics, and pressors.  Required intubation.  Seen by surgery and IR.  Had percutaneous cholecystostomy.  Developed renal failure.  Nephrology consulted.  Started on CRRT.  Family agreed to DNR status.  Developed progressive acidosis, shock, and elevated liver enzymes.  She expired on 04-17-2018 at 1647.  Final diagnoses: Septic shock from hemorrhagic cholecystitis with E coli Acute hypoxic respiratory failure Acute renal failure from ATN Metabolic acidosis with lactic acidosis CKD 3 Acute hepatic failure from shock and cholecystitis Coagulopathy in setting of liver failure Anemia of critical illness Acute metabolic encephalopathy from sepsis and renal failure DM type II Hypoglycemia from sepsis Hyperkalemia  Chesley Mires, MD [place] 03/22/2018, 2:29 PM

## 2018-04-16 DEATH — deceased

## 2019-07-04 IMAGING — DX DG HIP (WITH OR WITHOUT PELVIS) 2-3V*R*
3 series · 3 of 3 positions shown · non-contrast
Comparison: None.

CLINICAL DATA: Pain right hip x 2 months/no known injury

EXAM:
DG HIP (WITH OR WITHOUT PELVIS) 2-3V RIGHT

[pelvis ap]
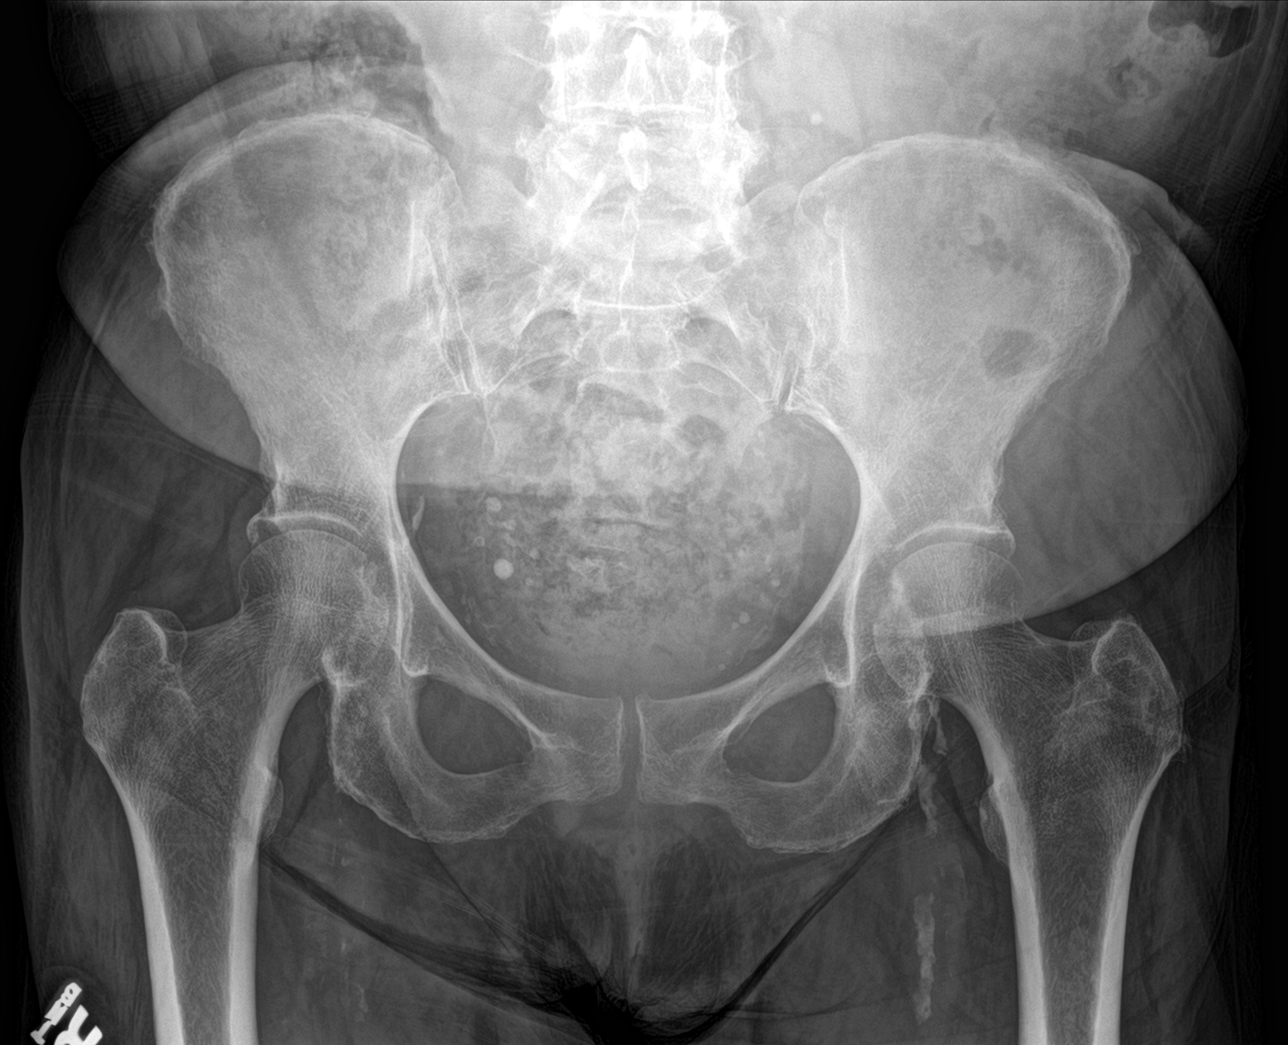

[hip ap]
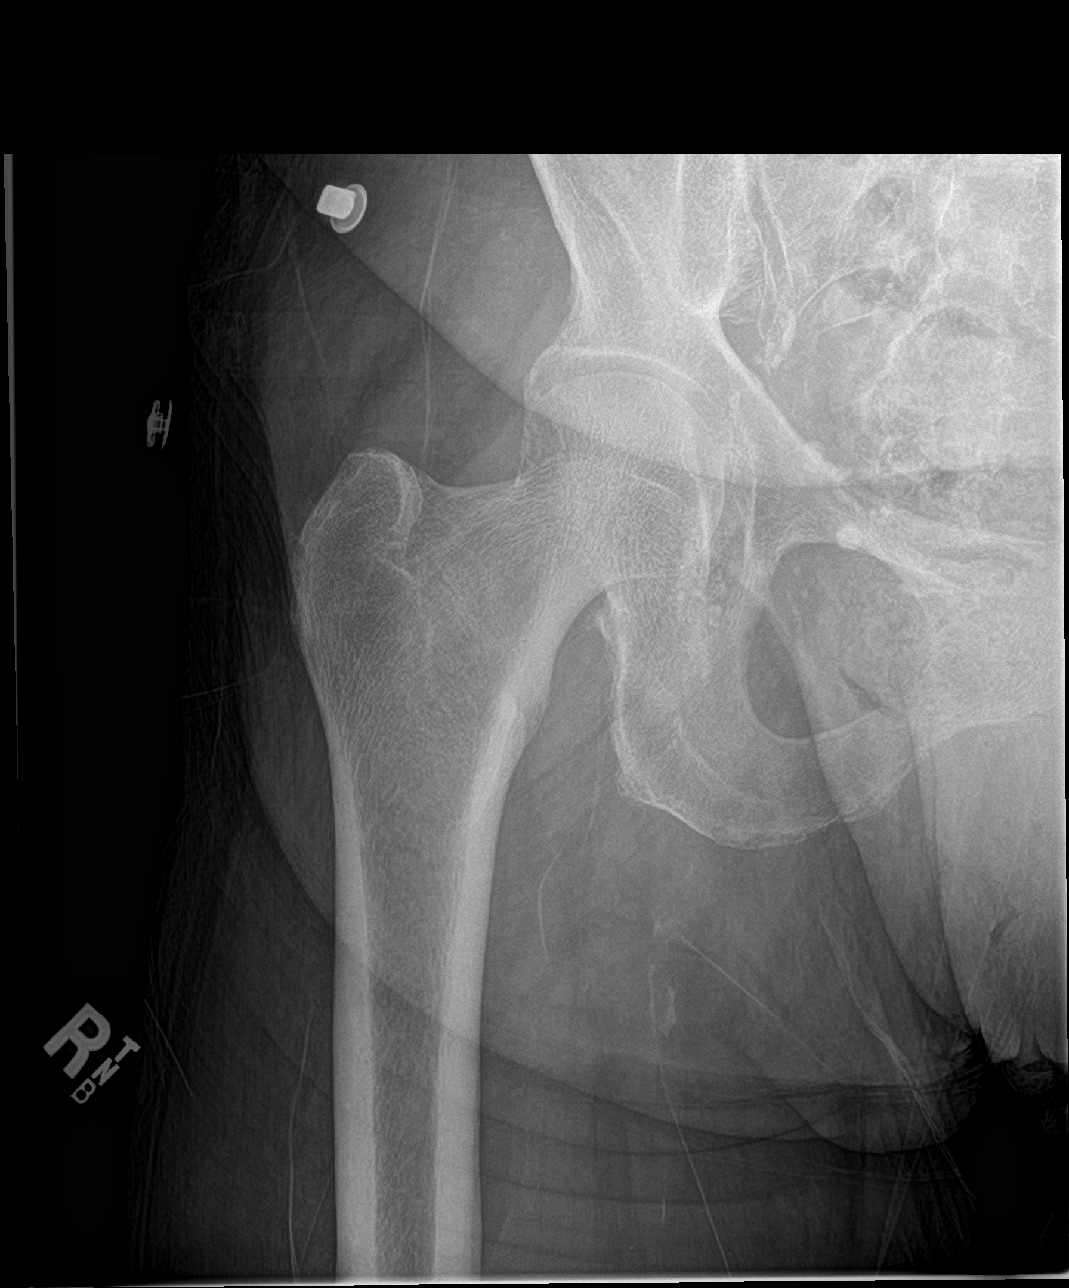

[hip lat]
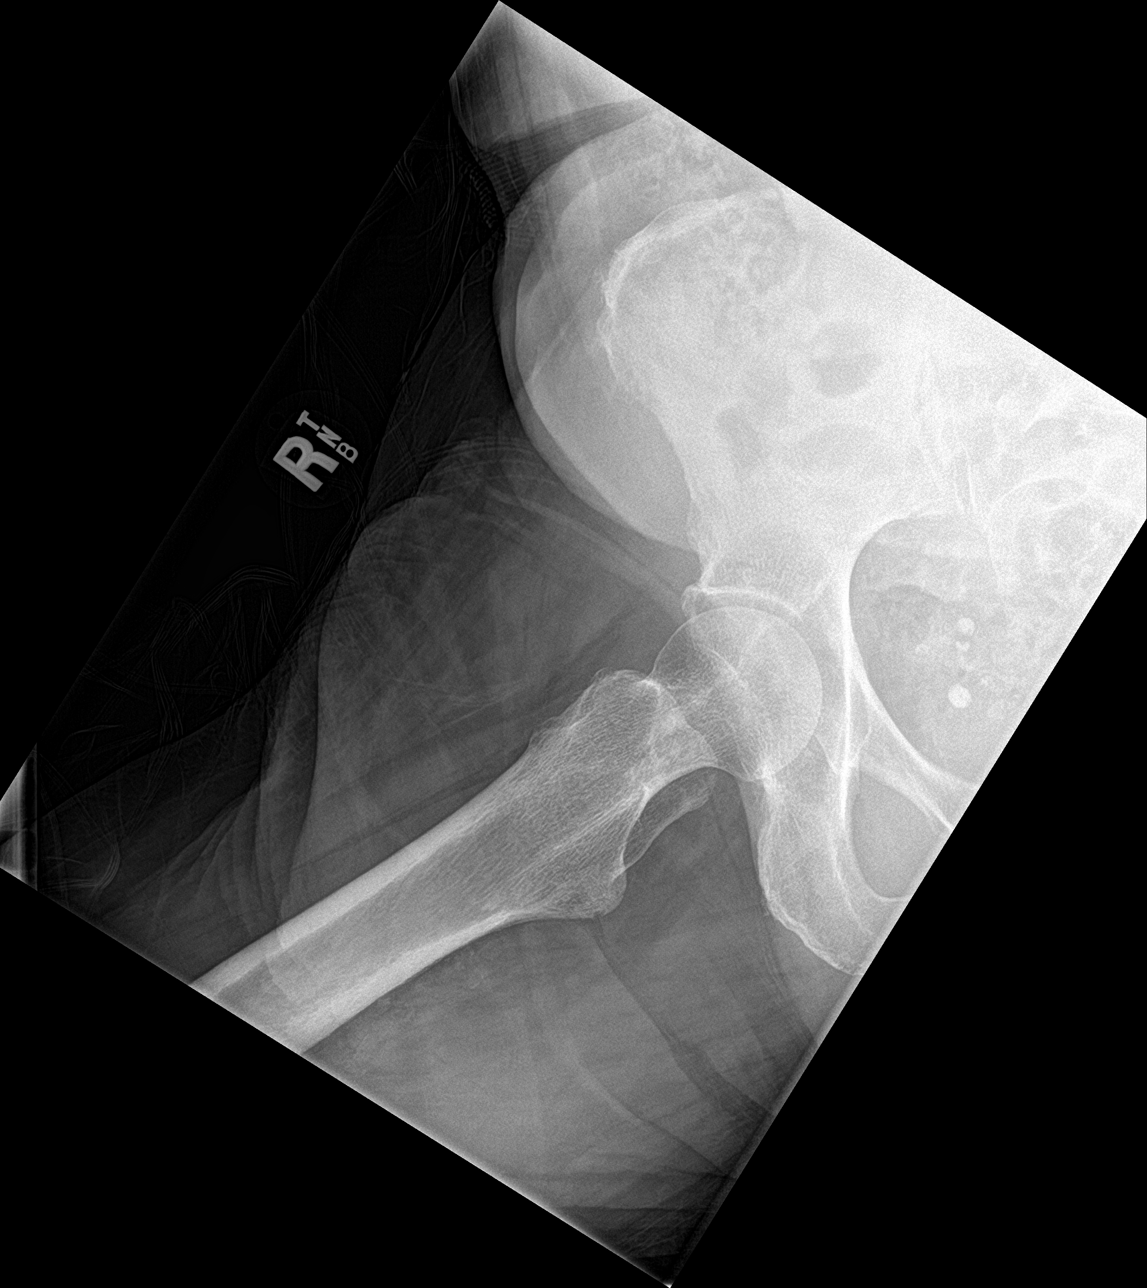

[3 of 3 positions shown; findings below may reference images not displayed]

FINDINGS: There is no evidence of hip fracture or dislocation. There is no
evidence of arthropathy or other focal bone abnormality. Vascular
calcifications noted within the upper thighs.
IMPRESSION: 1. No acute findings.  No degenerative change at the right hip.
2. Femoral atherosclerosis.
